# Patient Record
Sex: Male | Born: 1960 | Race: White | Hispanic: No | Marital: Single | Smoking: Never smoker
Health system: Southern US, Community
[De-identification: ages and names within clinical notes are randomized; demographics above are authoritative.]

## PROBLEM LIST (undated history)

## (undated) DIAGNOSIS — I10 Essential (primary) hypertension: Secondary | ICD-10-CM

---

## 2010-03-03 ENCOUNTER — Encounter: Payer: Self-pay | Admitting: Family Medicine

## 2012-12-11 ENCOUNTER — Ambulatory Visit: Payer: Self-pay

## 2014-12-27 DIAGNOSIS — H35033 Hypertensive retinopathy, bilateral: Secondary | ICD-10-CM | POA: Insufficient documentation

## 2014-12-27 DIAGNOSIS — H2510 Age-related nuclear cataract, unspecified eye: Secondary | ICD-10-CM | POA: Insufficient documentation

## 2015-01-03 DIAGNOSIS — B5801 Toxoplasma chorioretinitis: Secondary | ICD-10-CM | POA: Insufficient documentation

## 2015-01-31 DIAGNOSIS — H4312 Vitreous hemorrhage, left eye: Secondary | ICD-10-CM | POA: Insufficient documentation

## 2015-04-25 DIAGNOSIS — H35372 Puckering of macula, left eye: Secondary | ICD-10-CM | POA: Insufficient documentation

## 2015-10-12 ENCOUNTER — Encounter (HOSPITAL_COMMUNITY): Payer: Self-pay | Admitting: Emergency Medicine

## 2015-10-12 ENCOUNTER — Ambulatory Visit (INDEPENDENT_AMBULATORY_CARE_PROVIDER_SITE_OTHER): Payer: BLUE CROSS/BLUE SHIELD

## 2015-10-12 ENCOUNTER — Ambulatory Visit (HOSPITAL_COMMUNITY)
Admission: EM | Admit: 2015-10-12 | Discharge: 2015-10-12 | Disposition: A | Discharge status: Home / Self Care | Payer: BLUE CROSS/BLUE SHIELD | Attending: Family | Admitting: Family

## 2015-10-12 DIAGNOSIS — S2232XA Fracture of one rib, left side, initial encounter for closed fracture: Secondary | ICD-10-CM

## 2015-10-12 HISTORY — DX: Essential (primary) hypertension: I10

## 2015-10-12 MED ORDER — KETOROLAC TROMETHAMINE 60 MG/2ML IM SOLN
60.0000 mg | Freq: Once | INTRAMUSCULAR | Status: AC
  Administered 2015-10-12: 60 mg via INTRAMUSCULAR

## 2015-10-12 MED ORDER — DICLOFENAC SODIUM 75 MG PO TBEC: 75.0000 mg | DELAYED_RELEASE_TABLET | Freq: Two times a day (BID) | ORAL | 0 refills | Status: DC | PRN

## 2015-10-12 MED ORDER — KETOROLAC TROMETHAMINE 60 MG/2ML IM SOLN
INTRAMUSCULAR | Status: AC
  Filled 2015-10-12: qty 2

## 2015-10-12 MED ORDER — HYDROCODONE-ACETAMINOPHEN 5-325 MG PO TABS: 1.0000 | ORAL_TABLET | Freq: Four times a day (QID) | ORAL | 0 refills | Status: DC | PRN

## 2015-10-12 NOTE — ED Triage Notes (Signed)
Pt reports he fell from his deck today around 1500 and landed on left side of rib  Pain increases w/activity and when he is breathing  Slow gait... A&O x4... NAD

## 2015-10-12 NOTE — ED Provider Notes (Signed)
CSN: [id number]     Arrival date & time 10/12/15  1912 History   First MD Initiated Contact with Patient 10/12/15 2023     Chief Complaint  Patient presents with  . Rib Injury   (Consider location/radiation/quality/duration/timing/severity/associated sxs/prior Treatment) 55 year old male presents with left sided thoracic back pain after a fall this afternoon. He was carrying mason jars in a box when he stepped down off his deck and slipped. He landed on his left side on his back. He did not hit any other area of his body. He experiences an Increase in pain when he moves and breathes deeply. Took 2 Aleve and still very sore. Concerned over fractured ribs. He has fractured ribs when he was a teenager. No other chronic health issues except history of hypertension- takes no daily medication.       Past Medical History:  Diagnosis Date  . Hypertension    History reviewed. No pertinent surgical history. History reviewed. No pertinent family history. Social History  Substance Use Topics  . Smoking status: Never Smoker  . Smokeless tobacco: Never Used  . Alcohol use No    Review of Systems  Constitutional: Negative for fever.  Respiratory: Negative for cough, chest tightness and shortness of breath.   Gastrointestinal: Negative for nausea and vomiting.  Genitourinary: Negative for dysuria, flank pain and hematuria.  Musculoskeletal: Positive for back pain. Negative for neck pain.  Skin: Negative for color change and wound.  Neurological: Negative for dizziness, tremors, syncope, weakness, light-headedness, numbness and headaches.  Hematological: Negative.     Allergies  Review of patient's allergies indicates no known allergies.  Home Medications   Prior to Admission medications   Medication Sig Start Date End Date Taking? Authorizing Provider  diclofenac (VOLTAREN) 75 MG EC tablet Take 1 tablet (75 mg total) by mouth 2 (two) times daily as needed for moderate pain. 10/12/15   Sudie Grumbling, NP  HYDROcodone-acetaminophen (NORCO/VICODIN) 5-325 MG tablet Take 1-2 tablets by mouth every 6 (six) hours as needed for moderate pain. 10/12/15   Sudie Grumbling, NP   Meds Ordered and Administered this Visit   Medications  ketorolac (TORADOL) injection 60 mg (60 mg Intramuscular Given 10/12/15 2051)    BP 136/84 (BP Location: Left Arm)   Pulse 91   Temp 98.7 F (37.1 C) (Oral)   Resp 16   SpO2 98%  No data found.   Physical Exam  Constitutional: He is oriented to person, place, and time. He appears well-developed and well-nourished. No distress.  HENT:  Head: Normocephalic and atraumatic.  Neck: Normal range of motion. Neck supple.  Cardiovascular: Normal rate, regular rhythm and normal heart sounds.   Pulmonary/Chest: Effort normal and breath sounds normal. No respiratory distress. He has no wheezes. He has no rales. He exhibits no tenderness.  Musculoskeletal: He exhibits tenderness.       Thoracic back: He exhibits tenderness, swelling and pain.       Back:  Very tender along 7th/8th rib posterior mid-scapular. Slight swelling present but no bruising or abrasions. Has full range of motion of back but pain with extension.   Neurological: He is alert and oriented to person, place, and time. He has normal strength. No sensory deficit. Coordination and gait normal.  Skin: Skin is warm and dry. Capillary refill takes less than 2 seconds. No erythema.  Psychiatric: He has a normal mood and affect. His behavior is normal. Judgment and thought content normal.    Urgent  Care Course   Clinical Course    Procedures (including critical care time)  Labs Review Labs Reviewed - No data to display  Imaging Review Dg Chest 2 View  Result Date: 10/12/2015 CLINICAL DATA:  Patient fell at 3 p.m. today. Abrasions to the right shoulder. Left-sided chest and rib pain. Previous rib fractures at the age of 55. EXAM: CHEST  2 VIEW COMPARISON:  None. FINDINGS: Shallow  inspiration with elevation of the left hemidiaphragm. Normal heart size and pulmonary vascularity. Central interstitial pattern to the lungs suggest chronic bronchitis. No focal airspace disease or consolidation. No blunting of costophrenic angles. No pneumothorax. Multiple left rib deformities likely representing old fractures. Probable acute fracture of the left posterior eighth rib. Wedge deformity of T12 is likely chronic given associated degenerative changes. IMPRESSION: Elevation of left hemidiaphragm. Probable chronic bronchitic changes in the lungs. Probable acute left posterior eighth rib fracture. Old appearing left rib fractures and T12 compression. Electronically Signed   By: Burman NievesWilliam  Stevens M.D.   On: 10/12/2015 20:30     Visual Acuity Review  Right Eye Distance:   Left Eye Distance:   Bilateral Distance:    Right Eye Near:   Left Eye Near:    Bilateral Near:         MDM   1. Rib fracture, left, closed, initial encounter    Reviewed x-ray findings with patient that showed 8th posterior rib fracture. Discussed findings on x-ray suggestive of chronic bronchitis. Pt admits to smoking cigars on occasion. Due to severe pain, patient given Toradol 60mg  IM today. Recommend Diclofenac 75mg  twice a day as needed for pain. May take Vicodin #10 1-2 tablets every 6 hours as needed. Reviewed [place] Controlled Substance Registry for past and current Rx- no entries found. Encouraged to take deep breaths to minimize risk of pneumonia. Recommend follow-up with his PCP if symptoms do not improve within 3 days.     Sudie GrumblingAnn Berry Elster Corbello, NP 10/12/15 [phone]

## 2015-10-12 NOTE — Discharge Instructions (Signed)
You were given a shot of Toradol today to help with pain. Take Diclofenac twice a day as needed for pain. May also take Vicodin every 6 hours as needed for pain. Encouraged to continue to take deep breaths. Follow-up with your primary care provider if pain does not improve within 3 to 5 days.

## 2015-10-14 ENCOUNTER — Ambulatory Visit (HOSPITAL_COMMUNITY)
Admission: EM | Admit: 2015-10-14 | Discharge: 2015-10-14 | Disposition: A | Discharge status: Home / Self Care | Payer: BLUE CROSS/BLUE SHIELD | Attending: Family Medicine | Admitting: Family Medicine

## 2015-10-14 ENCOUNTER — Encounter (HOSPITAL_COMMUNITY): Payer: Self-pay | Admitting: Emergency Medicine

## 2015-10-14 DIAGNOSIS — R0781 Pleurodynia: Secondary | ICD-10-CM | POA: Diagnosis not present

## 2015-10-14 DIAGNOSIS — R059 Cough, unspecified: Secondary | ICD-10-CM

## 2015-10-14 DIAGNOSIS — R05 Cough: Secondary | ICD-10-CM

## 2015-10-14 MED ORDER — TRAMADOL HCL 50 MG PO TABS: 50.0000 mg | ORAL_TABLET | Freq: Four times a day (QID) | ORAL | 0 refills | Status: DC | PRN

## 2015-10-14 MED ORDER — ALBUTEROL SULFATE HFA 108 (90 BASE) MCG/ACT IN AERS: 2.0000 | INHALATION_SPRAY | Freq: Four times a day (QID) | RESPIRATORY_TRACT | 0 refills | Status: DC | PRN

## 2015-10-14 MED ORDER — ALBUTEROL SULFATE HFA 108 (90 BASE) MCG/ACT IN AERS: 2.0000 | INHALATION_SPRAY | Freq: Four times a day (QID) | RESPIRATORY_TRACT | 0 refills | Status: AC | PRN

## 2015-10-14 NOTE — ED Triage Notes (Signed)
Pt c/o persistent pain on left rib... Seen here on 8/31 and treated for fx rib  Reports he is running out of his pain meds  A&O x4... NAD

## 2015-10-14 NOTE — Discharge Instructions (Signed)
Continue Diclofenac twice a day as needed for pain and inflammation. Start Tramadol every 6 hours as needed for pain. Use Albuterol inhaler every 6 hours for cough/wheezing. Recommend referral to Orthopedics for further evaluation and treatment.

## 2015-10-15 NOTE — ED Provider Notes (Signed)
CSN: [id number]     Arrival date & time 10/14/15  1606 History   First MD Initiated Contact with Patient 10/14/15 1742     Chief Complaint  Patient presents with  . Follow-up  . Rib Injury   (Consider location/radiation/quality/duration/timing/severity/associated sxs/prior Treatment) 55 year old male presents for follow-up to rib fracture that occurred on 8/31. Was seen here at Urgent Care- was given a shot of Toradol, started on Diclofenac and given Vicodin 5/325 #10. His pain was controlled on 8/31 but pain is increasing since yesterday. He only has one Vicodin left. He is also coughing more which makes the rib pain worse. He has used Albuterol inhalers in the past for cough with good success. His blood pressure is also elevated today- may be due to pain.       Past Medical History:  Diagnosis Date  . Hypertension    History reviewed. No pertinent surgical history. History reviewed. No pertinent family history. Social History  Substance Use Topics  . Smoking status: Never Smoker  . Smokeless tobacco: Never Used  . Alcohol use No    Review of Systems  Constitutional: Negative for chills, fatigue and fever.  Respiratory: Positive for cough and wheezing. Negative for chest tightness and shortness of breath.   Cardiovascular: Chest pain: rib pain.  Gastrointestinal: Negative for constipation, nausea and vomiting.  Musculoskeletal: Positive for back pain (rib pain) and myalgias. Negative for joint swelling.  Skin: Negative.   Neurological: Negative for dizziness, syncope, weakness and numbness.    Allergies  Review of patient's allergies indicates no known allergies.  Home Medications   Prior to Admission medications   Medication Sig Start Date End Date Taking? Authorizing Provider  diclofenac (VOLTAREN) 75 MG EC tablet Take 1 tablet (75 mg total) by mouth 2 (two) times daily as needed for moderate pain. 10/12/15  Yes Sudie GrumblingAnn Berry Arik Husmann, NP  albuterol (PROVENTIL HFA;VENTOLIN  HFA) 108 (90 Base) MCG/ACT inhaler Inhale 2 puffs into the lungs every 6 (six) hours as needed for wheezing (or coughing). 10/14/15   Sudie GrumblingAnn Berry Modesty Rudy, NP  traMADol (ULTRAM) 50 MG tablet Take 1 tablet (50 mg total) by mouth every 6 (six) hours as needed for severe pain. 10/14/15   Sudie GrumblingAnn Berry Haygen Zebrowski, NP   Meds Ordered and Administered this Visit  Medications - No data to display  BP (!) 156/106 (BP Location: Left Arm)   Pulse 98   Temp 98.6 F (37 C) (Oral)   Resp 16   SpO2 97%  No data found.   Physical Exam  Constitutional: He is oriented to person, place, and time. He appears well-developed and well-nourished. He is cooperative. No distress.  Cardiovascular: Normal rate, regular rhythm and normal heart sounds.   Pulmonary/Chest: Effort normal. No respiratory distress. He has wheezes in the right upper field and the left upper field. He has no rhonchi. He has no rales.     He exhibits tenderness. He exhibits no edema, no deformity and no swelling.  Very tender along 7th and 8th rib posteriorly, No bruising or redness. Pain with any movement. Has full air exchange but upper airway wheezes present.    Musculoskeletal: He exhibits tenderness.  Neurological: He is alert and oriented to person, place, and time.  Skin: Skin is warm and dry.  Psychiatric: He has a normal mood and affect. His behavior is normal. Judgment and thought content normal.    Urgent Care Course   Clinical Course    Procedures (including critical care  time)  Labs Review Labs Reviewed - No data to display  Imaging Review No results found.   Visual Acuity Review  Right Eye Distance:   Left Eye Distance:   Bilateral Distance:    Right Eye Near:   Left Eye Near:    Bilateral Near:         MDM   1. Rib pain on left side   2. Cough    Recommend Albuterol inhaler 2 puffs every 6 hours as needed for wheezing and cough. Encouraged to splint chest with pillow for comfort when coughing. May take  Tramadol 50mg  #20 1 tablet every 6 hours as needed for pain. Recommend referral to Orthopedic through PCP for additional management of rib fracture. Rest. May apply warm compresses to area for comfort. Follow-up with Orthopedic as planned.     Sudie Grumbling, NP 10/15/15 [phone]

## 2015-12-20 DIAGNOSIS — H30002 Unspecified focal chorioretinal inflammation, left eye: Secondary | ICD-10-CM | POA: Insufficient documentation

## 2017-04-14 IMAGING — DX DG CHEST 2V
3 series · 3 of 3 positions shown · non-contrast
Comparison: None.

CLINICAL DATA: Patient fell at 3 p.m. today. Abrasions to the right
shoulder. Left-sided chest and rib pain. Previous rib fractures at
the age of 15.

EXAM:
CHEST  2 VIEW

[chest pa]
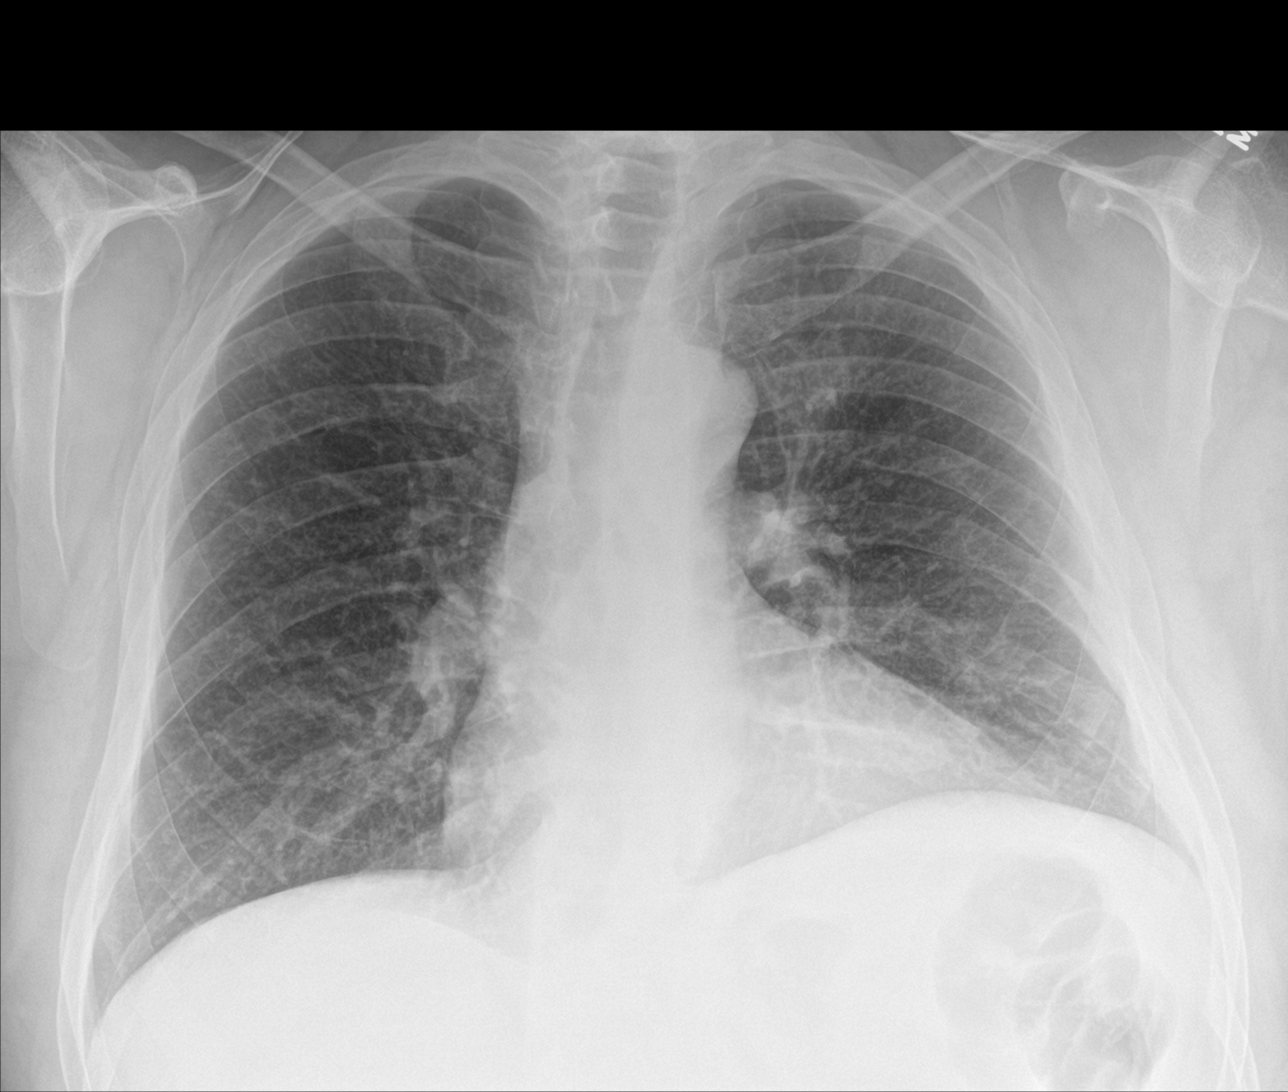

[chest lat (1 of 2)]
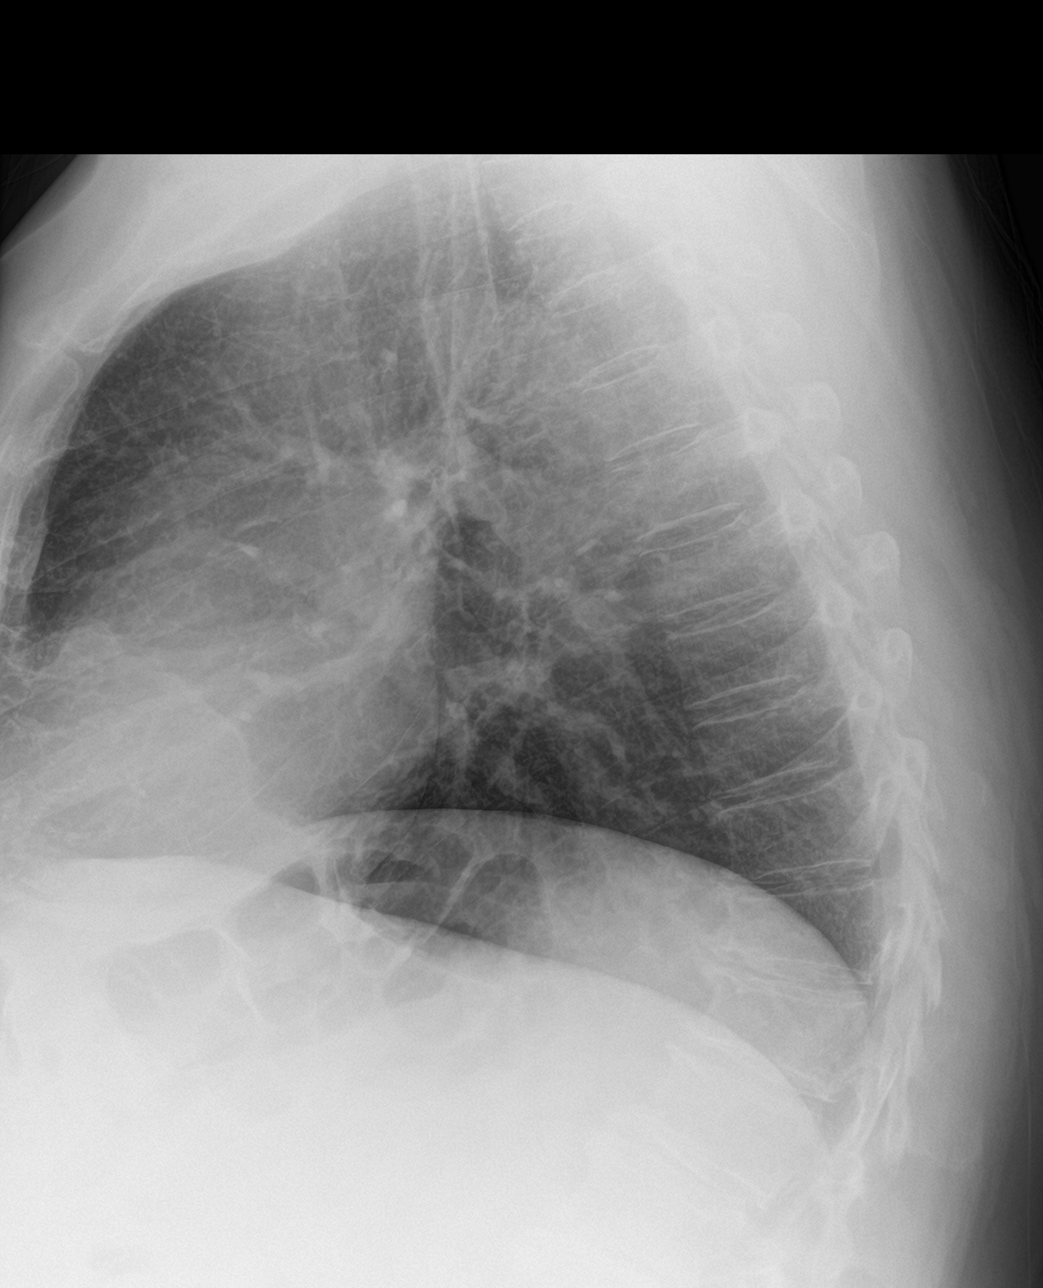

[chest lat (2 of 2)]
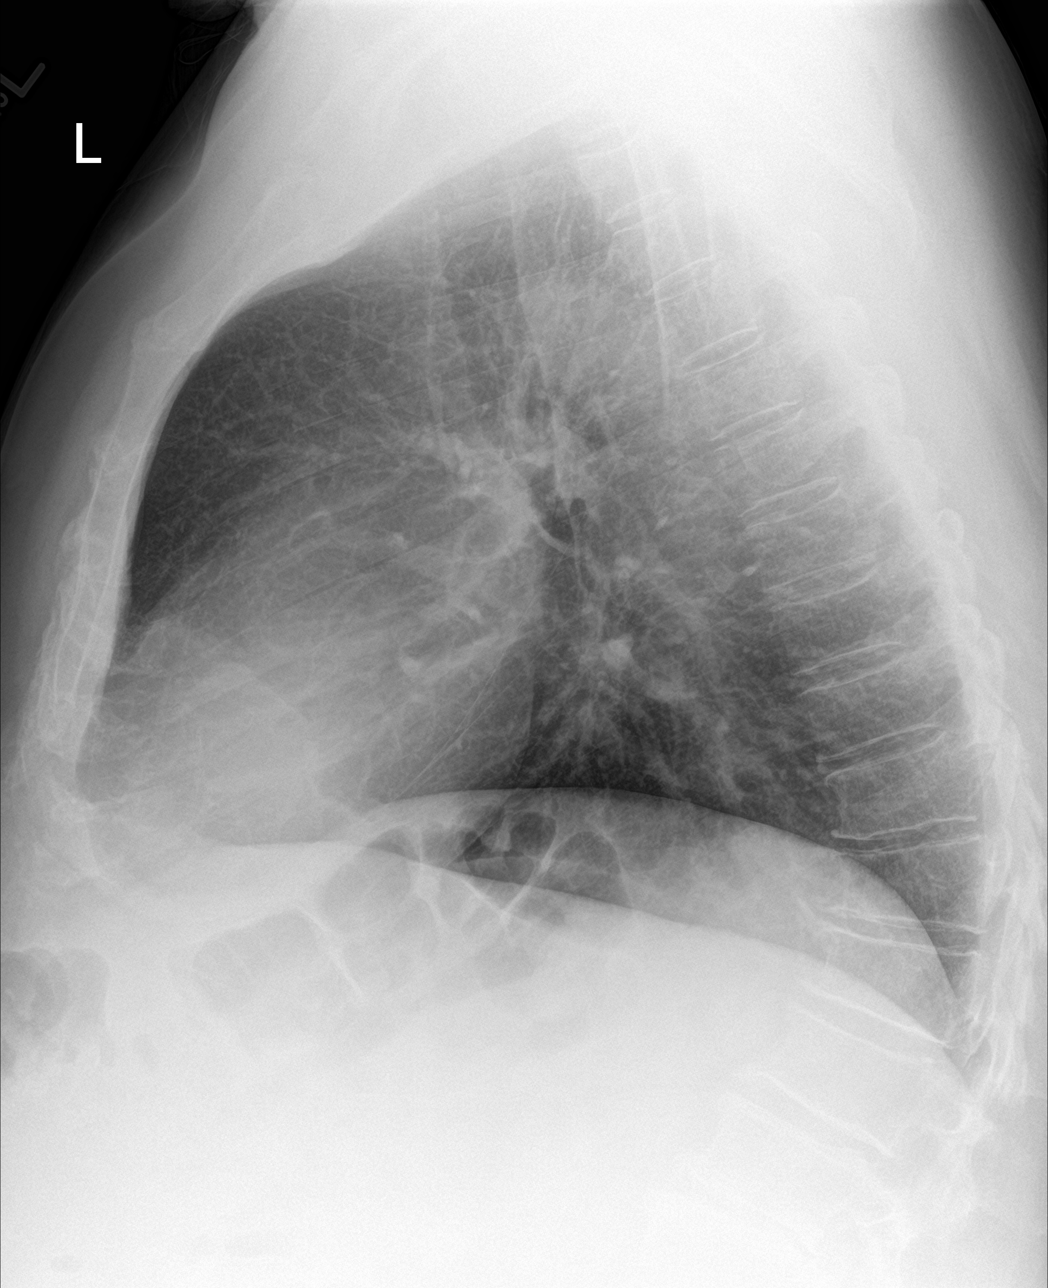

[3 of 3 positions shown; findings below may reference images not displayed]

FINDINGS: Shallow inspiration with elevation of the left hemidiaphragm. Normal
heart size and pulmonary vascularity. Central interstitial pattern
to the lungs suggest chronic bronchitis. No focal airspace disease
or consolidation. No blunting of costophrenic angles. No
pneumothorax. Multiple left rib deformities likely representing old
fractures. Probable acute fracture of the left posterior eighth rib.
Wedge deformity of T12 is likely chronic given associated
degenerative changes.
IMPRESSION: Elevation of left hemidiaphragm. Probable chronic bronchitic changes
in the lungs. Probable acute left posterior eighth rib fracture. Old
appearing left rib fractures and T12 compression.

## 2017-12-17 DIAGNOSIS — H5315 Visual distortions of shape and size: Secondary | ICD-10-CM | POA: Insufficient documentation

## 2017-12-17 DIAGNOSIS — H5231 Anisometropia: Secondary | ICD-10-CM | POA: Insufficient documentation

## 2019-08-18 DIAGNOSIS — H43822 Vitreomacular adhesion, left eye: Secondary | ICD-10-CM | POA: Insufficient documentation

## 2019-08-18 DIAGNOSIS — H2513 Age-related nuclear cataract, bilateral: Secondary | ICD-10-CM | POA: Insufficient documentation

## 2021-12-29 DIAGNOSIS — J189 Pneumonia, unspecified organism: Secondary | ICD-10-CM | POA: Insufficient documentation

## 2021-12-29 DIAGNOSIS — E871 Hypo-osmolality and hyponatremia: Secondary | ICD-10-CM | POA: Insufficient documentation

## 2021-12-29 DIAGNOSIS — D62 Acute posthemorrhagic anemia: Secondary | ICD-10-CM | POA: Insufficient documentation

## 2021-12-29 DIAGNOSIS — J9601 Acute respiratory failure with hypoxia: Secondary | ICD-10-CM | POA: Insufficient documentation

## 2021-12-29 DIAGNOSIS — I10 Essential (primary) hypertension: Secondary | ICD-10-CM | POA: Insufficient documentation

## 2021-12-29 DIAGNOSIS — J90 Pleural effusion, not elsewhere classified: Secondary | ICD-10-CM | POA: Insufficient documentation

## 2021-12-30 DIAGNOSIS — E222 Syndrome of inappropriate secretion of antidiuretic hormone: Secondary | ICD-10-CM | POA: Insufficient documentation

## 2022-01-08 ENCOUNTER — Other Ambulatory Visit: Payer: Self-pay

## 2022-01-08 DIAGNOSIS — J90 Pleural effusion, not elsewhere classified: Secondary | ICD-10-CM

## 2022-01-24 ENCOUNTER — Institutional Professional Consult (permissible substitution) (INDEPENDENT_AMBULATORY_CARE_PROVIDER_SITE_OTHER): Payer: Commercial Managed Care - HMO | Admitting: Thoracic Surgery (Cardiothoracic Vascular Surgery)

## 2022-01-24 ENCOUNTER — Encounter: Payer: Self-pay | Admitting: Thoracic Surgery (Cardiothoracic Vascular Surgery)

## 2022-01-24 ENCOUNTER — Ambulatory Visit
Admission: RE | Admit: 2022-01-24 | Discharge: 2022-01-24 | Disposition: A | Discharge status: Home / Self Care | Payer: Commercial Managed Care - HMO | Source: Ambulatory Visit | Attending: Thoracic Surgery (Cardiothoracic Vascular Surgery) | Admitting: Thoracic Surgery (Cardiothoracic Vascular Surgery)

## 2022-01-24 VITALS — BP 115/78 | HR 82 | Resp 20 | Ht 72.0 in | Wt 263.0 lb

## 2022-01-24 DIAGNOSIS — J90 Pleural effusion, not elsewhere classified: Secondary | ICD-10-CM

## 2022-01-24 DIAGNOSIS — J942 Hemothorax: Secondary | ICD-10-CM
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
## 2022-01-30 ENCOUNTER — Other Ambulatory Visit: Payer: Self-pay | Admitting: *Deleted

## 2022-01-30 DIAGNOSIS — J90 Pleural effusion, not elsewhere classified: Secondary | ICD-10-CM

## 2022-02-07 ENCOUNTER — Other Ambulatory Visit: Payer: Self-pay

## 2022-02-07 ENCOUNTER — Encounter: Payer: Self-pay | Admitting: Thoracic Surgery (Cardiothoracic Vascular Surgery)

## 2022-02-07 ENCOUNTER — Ambulatory Visit
Admission: RE | Admit: 2022-02-07 | Discharge: 2022-02-07 | Disposition: A | Discharge status: Home / Self Care | Payer: Commercial Managed Care - HMO | Source: Ambulatory Visit | Attending: Thoracic Surgery (Cardiothoracic Vascular Surgery) | Admitting: Thoracic Surgery (Cardiothoracic Vascular Surgery)

## 2022-02-07 ENCOUNTER — Ambulatory Visit (INDEPENDENT_AMBULATORY_CARE_PROVIDER_SITE_OTHER): Payer: Commercial Managed Care - HMO | Admitting: Thoracic Surgery (Cardiothoracic Vascular Surgery)

## 2022-02-07 VITALS — BP 140/88 | HR 78 | Resp 20 | Ht 72.0 in | Wt 260.0 lb

## 2022-02-07 DIAGNOSIS — J90 Pleural effusion, not elsewhere classified: Secondary | ICD-10-CM

## 2022-02-07 DIAGNOSIS — J942 Hemothorax: Secondary | ICD-10-CM | POA: Diagnosis not present
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
## 2022-02-08 ENCOUNTER — Other Ambulatory Visit: Payer: Self-pay | Admitting: *Deleted

## 2022-02-08 DIAGNOSIS — J90 Pleural effusion, not elsewhere classified: Secondary | ICD-10-CM

## 2022-02-13 ENCOUNTER — Ambulatory Visit: Payer: Commercial Managed Care - HMO | Admitting: Thoracic Surgery (Cardiothoracic Vascular Surgery)

## 2022-02-14 NOTE — Progress Notes (Signed)
Surgical Instructions    Your procedure is scheduled on Monday, January 8th, 2024.   Report to [facility] Main Entrance "A" at 09:30 A.M., then check in with the Admitting office.  Call this number if you have problems the morning of surgery:  [phone]   If you have any questions prior to your surgery date call [phone]: Open Monday-Friday 8am-4pm If you experience any cold or flu symptoms such as cough, fever, chills, shortness of breath, etc. between now and your scheduled surgery, please notify us at the above number     Remember:  Do not eat or drink after midnight the night before your surgery    Take these medicines the morning of surgery with A SIP OF WATER:   bisoprolol (ZEBETA)  tamsulosin (FLOMAX)   As needed:  albuterol (PROVENTIL HFA;VENTOLIN HFA)  HYDROcodone-acetaminophen (NORCO/VICODIN)   Please bring all inhalers with you the day of surgery.    As of today, STOP taking any Aspirin (unless otherwise instructed by your surgeon) Aleve, Naproxen, Ibuprofen, Motrin, Advil, Goody's, BC's, all herbal medications, fish oil, and all vitamins.   The day of surgery:           Do not wear jewelry  Do not wear lotions, powders, cologne or deodorant. Men may shave face and neck. Do not bring valuables to the hospital.  [facility] is not responsible for any belongings or valuables.    Do NOT Smoke (Tobacco/Vaping)  24 hours prior to your procedure  If you use a CPAP at night, you may bring your mask for your overnight stay.   Contacts, glasses, hearing aids, dentures or partials may not be worn into surgery, please bring cases for these belongings   For patients admitted to the hospital, discharge time will be determined by your treatment team.   Patients discharged the day of surgery will not be allowed to drive home, and someone needs to stay with them for 24 hours.   SURGICAL WAITING ROOM VISITATION Patients having surgery or a procedure may have no  more than 2 support people in the waiting area - these visitors may rotate.   Children under the age of 63 must have an adult with them who is not the patient. If the patient needs to stay at the hospital during part of their recovery, the visitor guidelines for inpatient rooms apply. Pre-op nurse will coordinate an appropriate time for 1 support person to accompany patient in pre-op.  This support person may not rotate.   Please refer to [email or web address] for the visitor guidelines for Inpatients (after your surgery is over and you are in a regular room).    Special instructions:    Oral Hygiene is also important to reduce your risk of infection.  Remember - BRUSH YOUR TEETH THE MORNING OF SURGERY WITH YOUR REGULAR TOOTHPASTE   [place]- Preparing For Surgery  Before surgery, you can play an important role. Because skin is not sterile, your skin needs to be as free of germs as possible. You can reduce the number of germs on your skin by washing with CHG (chlorahexidine gluconate) Soap before surgery.  CHG is an antiseptic cleaner which kills germs and bonds with the skin to continue killing germs even after washing.     Please do not use if you have an allergy to CHG or antibacterial soaps. If your skin becomes reddened/irritated stop using the CHG.  Do not shave (including legs and underarms) for at least 48 hours prior to  first CHG shower. It is OK to shave your face.  Please follow these instructions carefully.     Shower the NIGHT BEFORE SURGERY and the MORNING OF SURGERY with CHG Soap.   If you chose to wash your hair, wash your hair first as usual with your normal shampoo. After you shampoo, rinse your hair and body thoroughly to remove the shampoo.  Then [employer] and genitals (private parts) with your normal soap and rinse thoroughly to remove soap.  After that Use CHG Soap as you would any other liquid soap. You can apply CHG  directly to the skin and wash gently with a scrungie or a clean washcloth.   Apply the CHG Soap to your body ONLY FROM THE NECK DOWN.  Do not use on open wounds or open sores. Avoid contact with your eyes, ears, mouth and genitals (private parts). Wash Face and genitals (private parts)  with your normal soap.   Wash thoroughly, paying special attention to the area where your surgery will be performed.  Thoroughly rinse your body with warm water from the neck down.  DO NOT shower/wash with your normal soap after using and rinsing off the CHG Soap.  Pat yourself dry with a CLEAN TOWEL.  Wear CLEAN PAJAMAS to bed the night before surgery  Place CLEAN SHEETS on your bed the night before your surgery  DO NOT SLEEP WITH PETS.   Day of Surgery:  Take a shower with CHG soap. Wear Clean/Comfortable clothing the morning of surgery Do not apply any deodorants/lotions.   Remember to brush your teeth WITH YOUR REGULAR TOOTHPASTE.    If you received a COVID test during your pre-op visit, it is requested that you wear a mask when out in public, stay away from anyone that may not be feeling well, and notify your surgeon if you develop symptoms. If you have been in contact with anyone that has tested positive in the last 10 days, please notify your surgeon.    Please read over the following fact sheets that you were given.

## 2022-02-15 ENCOUNTER — Other Ambulatory Visit: Payer: Self-pay | Admitting: *Deleted

## 2022-02-15 ENCOUNTER — Ambulatory Visit (HOSPITAL_COMMUNITY)
Admission: RE | Admit: 2022-02-15 | Discharge: 2022-02-15 | Disposition: A | Discharge status: Home / Self Care | Payer: Commercial Managed Care - HMO | Source: Ambulatory Visit | Attending: Thoracic Surgery (Cardiothoracic Vascular Surgery) | Admitting: Thoracic Surgery (Cardiothoracic Vascular Surgery)

## 2022-02-15 ENCOUNTER — Encounter (HOSPITAL_COMMUNITY): Payer: Self-pay | Admitting: Vascular Surgery

## 2022-02-15 ENCOUNTER — Encounter (HOSPITAL_COMMUNITY): Payer: Self-pay

## 2022-02-15 ENCOUNTER — Other Ambulatory Visit: Payer: Self-pay

## 2022-02-15 ENCOUNTER — Encounter (HOSPITAL_COMMUNITY)
Admission: RE | Admit: 2022-02-15 | Discharge: 2022-02-15 | Disposition: A | Discharge status: Home / Self Care | Payer: Commercial Managed Care - HMO | Source: Ambulatory Visit | Attending: Thoracic Surgery (Cardiothoracic Vascular Surgery) | Admitting: Thoracic Surgery (Cardiothoracic Vascular Surgery)

## 2022-02-15 DIAGNOSIS — Z01818 Encounter for other preprocedural examination: Secondary | ICD-10-CM | POA: Insufficient documentation

## 2022-02-15 DIAGNOSIS — M4854XA Collapsed vertebra, not elsewhere classified, thoracic region, initial encounter for fracture: Secondary | ICD-10-CM | POA: Diagnosis not present

## 2022-02-15 DIAGNOSIS — I10 Essential (primary) hypertension: Secondary | ICD-10-CM | POA: Diagnosis not present

## 2022-02-15 DIAGNOSIS — E871 Hypo-osmolality and hyponatremia: Secondary | ICD-10-CM | POA: Diagnosis not present

## 2022-02-15 DIAGNOSIS — J90 Pleural effusion, not elsewhere classified: Secondary | ICD-10-CM | POA: Insufficient documentation

## 2022-02-15 LAB — COMPREHENSIVE METABOLIC PANEL
ALT: 20 U/L (ref 0–44)
AST: 29 U/L (ref 15–41)
Albumin: 4 g/dL (ref 3.5–5.0)
Alkaline Phosphatase: 86 U/L (ref 38–126)
Anion gap: 13 (ref 5–15)
BUN: 12 mg/dL (ref 6–20)
CO2: 23 mmol/L (ref 22–32)
Calcium: 9.4 mg/dL (ref 8.9–10.3)
Chloride: 92 mmol/L — ABNORMAL LOW (ref 98–111)
Creatinine, Ser: 0.9 mg/dL (ref 0.61–1.24)
GFR, Estimated: >60 mL/min (ref >60)
Glucose, Bld: 107 mg/dL — ABNORMAL HIGH (ref 70–99)
Potassium: 4.6 mmol/L (ref 3.5–5.1)
Sodium: 128 mmol/L — ABNORMAL LOW (ref 135–145)
Total Bilirubin: 0.5 mg/dL (ref 0.3–1.2)
Total Protein: 7.2 g/dL (ref 6.5–8.1)

## 2022-02-15 LAB — URINALYSIS, ROUTINE W REFLEX MICROSCOPIC
Bilirubin Urine: NEGATIVE
Glucose, UA: NEGATIVE mg/dL
Hgb urine dipstick: NEGATIVE
Ketones, ur: NEGATIVE mg/dL
Leukocytes,Ua: NEGATIVE
Nitrite: NEGATIVE
Protein, ur: NEGATIVE mg/dL
Specific Gravity, Urine: 1.013 (ref 1.005–1.030)
pH: 5 (ref 5.0–8.0)

## 2022-02-15 LAB — BLOOD GAS, ARTERIAL
Acid-Base Excess: 5.6 mmol/L — ABNORMAL HIGH (ref 0.0–2.0)
Bicarbonate: 29.8 mmol/L — ABNORMAL HIGH (ref 20.0–28.0)
Drawn by: 6443
O2 Saturation: 96.3 %
Patient temperature: 37
pCO2 arterial: 41 mmHg (ref 32–48)
pH, Arterial: 7.47 — ABNORMAL HIGH (ref 7.35–7.45)
pO2, Arterial: 73 mmHg — ABNORMAL LOW (ref 83–108)

## 2022-02-15 LAB — CBC
HCT: 40.6 % (ref 39.0–52.0)
Hemoglobin: 14 g/dL (ref 13.0–17.0)
MCH: 33.4 pg (ref 26.0–34.0)
MCHC: 34.5 g/dL (ref 30.0–36.0)
MCV: 96.9 fL (ref 80.0–100.0)
Platelets: 246 10*3/uL (ref 150–400)
RBC: 4.19 MIL/uL — ABNORMAL LOW (ref 4.22–5.81)
RDW: 13.3 % (ref 11.5–15.5)
WBC: 8.1 10*3/uL (ref 4.0–10.5)
nRBC: 0 % (ref 0.0–0.2)

## 2022-02-15 LAB — TYPE AND SCREEN
ABO/RH(D): A POS
Antibody Screen: NEGATIVE

## 2022-02-15 LAB — SURGICAL PCR SCREEN
MRSA, PCR: NEGATIVE
Staphylococcus aureus: NEGATIVE

## 2022-02-15 LAB — PROTIME-INR
INR: 0.9 (ref 0.8–1.2)
Prothrombin Time: 12.5 seconds (ref 11.4–15.2)

## 2022-02-15 LAB — APTT: aPTT: 30 seconds (ref 24–36)
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
## 2022-02-15 NOTE — Progress Notes (Addendum)
PCP - Everardo Beals Cardiologist - Tonia Ghent  PPM/ICD - Denies  Chest x-ray - 02/15/2021 PAT  EKG - 02/15/2021 PAT Stress Test - Cards recommended Stress Test.  Per pt has not done yet ECHO - 12/30/2021 Cardiac Cath - Denies  Sleep Study - Denies.  Bang score faxed to PCP.   DM- Denies   Blood Thinner Instructions: Denies Aspirin Instructions:  As of today, STOP taking any Aspirin (unless otherwise instructed by your surgeon) Aleve, Naproxen, Ibuprofen, Motrin, Advil, Goody's, BC's, all herbal medications, fish oil, and all vitamins.  Pt expressed understanding.   ERAS Protcol - N/A.  NPO ordered.   COVID TEST- Yes, 02/15/2021   Anesthesia review: Yes, cardiac hx  Patient denies shortness of breath, fever, cough and chest pain at PAT appointment   All instructions explained to the patient, with a verbal understanding of the material. Patient agrees to go over the instructions while at home for a better understanding. Patient also instructed to self quarantine after being tested for COVID-19. The opportunity to ask questions was provided.

## 2022-02-18 ENCOUNTER — Inpatient Hospital Stay (HOSPITAL_COMMUNITY)
Admission: RE | Admit: 2022-02-18 | Payer: Commercial Managed Care - HMO | Source: Ambulatory Visit | Admitting: Thoracic Surgery (Cardiothoracic Vascular Surgery)

## 2022-02-18 SURGERY — VIDEO ASSISTED THORACOSCOPY (VATS)/DECORTICATION
Anesthesia: General | Site: Chest | Laterality: Right

## 2022-03-19 ENCOUNTER — Encounter: Payer: Self-pay | Admitting: Internal Medicine

## 2022-03-19 ENCOUNTER — Telehealth: Payer: Self-pay

## 2022-03-19 ENCOUNTER — Ambulatory Visit: Payer: Commercial Managed Care - HMO | Attending: Internal Medicine | Admitting: Internal Medicine

## 2022-03-19 VITALS — BP 166/100 | HR 70 | Ht 74.0 in | Wt 260.0 lb

## 2022-03-19 DIAGNOSIS — F1090 Alcohol use, unspecified, uncomplicated: Secondary | ICD-10-CM

## 2022-03-19 DIAGNOSIS — I10 Essential (primary) hypertension: Secondary | ICD-10-CM | POA: Diagnosis not present

## 2022-03-19 DIAGNOSIS — E222 Syndrome of inappropriate secretion of antidiuretic hormone: Secondary | ICD-10-CM

## 2022-03-19 DIAGNOSIS — J90 Pleural effusion, not elsewhere classified: Secondary | ICD-10-CM

## 2022-03-19 DIAGNOSIS — I4510 Unspecified right bundle-branch block: Secondary | ICD-10-CM

## 2022-03-19 DIAGNOSIS — I509 Heart failure, unspecified: Secondary | ICD-10-CM | POA: Diagnosis not present

## 2022-03-19 DIAGNOSIS — I440 Atrioventricular block, first degree: Secondary | ICD-10-CM

## 2022-03-19 DIAGNOSIS — Z789 Other specified health status: Secondary | ICD-10-CM | POA: Insufficient documentation

## 2022-03-19 MED ORDER — DAPAGLIFLOZIN PROPANEDIOL 10 MG PO TABS: 10.0000 mg | ORAL_TABLET | Freq: Every day | ORAL | 3 refills | Status: DC

## 2022-03-19 MED ORDER — METOPROLOL TARTRATE 100 MG PO TABS: ORAL_TABLET | ORAL | 0 refills | Status: AC

## 2022-03-19 MED ORDER — SPIRONOLACTONE 25 MG PO TABS: 25.0000 mg | ORAL_TABLET | Freq: Every day | ORAL | 3 refills | Status: DC
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
## 2022-03-19 NOTE — Telephone Encounter (Signed)
MD signature obtained and faxed to [employer].  Fax Ok received.

## 2022-03-19 NOTE — Patient Instructions (Signed)
Medication Instructions:  Your physician has recommended you make the following change in your medication:  START: dapagliflozin proponediol (Farxiga) 10 mg by mouth once daily before breakfast  We have provided you with 30 days of samples, a 30 day free trial offer and information for patient assistance START: spironolactone (Aldactone) 25 mg by mouth once daily  *If you need a refill on your cardiac medications before your next appointment, please call your pharmacy*   Lab Work: TODAY: BMP, BNP IN 1-2 WEEKS: BMP  If you have labs (blood work) drawn today and your tests are completely normal, you will receive your results only by: [place] (if you have MyChart) OR A paper copy in the mail If you have any lab test that is abnormal or we need to change your treatment, we will call you to review the results.   Testing/Procedures: Your physician has requested that you have an echocardiogram. Echocardiography is a painless test that uses sound waves to create images of your heart. It provides your doctor with information about the size and shape of your heart and how well your heart's chambers and valves are working. This procedure takes approximately one hour. There are no restrictions for this procedure. Please do NOT wear cologne, perfume, aftershave, or lotions (deodorant is allowed). Please arrive 15 minutes prior to your appointment time.  Your physician has requested that you have cardiac CT. Cardiac computed tomography (CT) is a painless test that uses an x-ray machine to take clear, detailed pictures of your heart. For further information please visit [email or web address].[email or web address]. Please follow instruction sheet as given.     Follow-Up: At [facility], you and your health needs are our priority.  As part of our continuing mission to provide you with exceptional heart care, we have created designated Provider Care Teams.  These Care Teams include your primary Cardiologist  (physician) and Advanced Practice Providers (APPs -  Physician Assistants and Nurse Practitioners) who all work together to provide you with the care you need, when you need it.  We recommend signing up for the patient portal called "MyChart".  Sign up information is provided on this After Visit Summary.  MyChart is used to connect with patients for Virtual Visits (Telemedicine).  Patients are able to view lab/test results, encounter notes, upcoming appointments, etc.  Non-urgent messages can be sent to your provider as well.   To learn more about what you can do with MyChart, go to [email or web address].    Your next appointment:   3 month(s)  Provider:   Werner Lean, MD  or Nicholes Rough, PA-C, Ambrose Pancoast, NP, or Christen Bame, NP         Other Instructions  Your cardiac CT will be scheduled at one of the below locations:   [facility] [address] [place], [place] [address] [phone]  [place] [address] [place], [place] [address] [phone]  [facility] [place], [place] [address] [phone]  If scheduled at [facility], please arrive at the [facility] and Children's Entrance (Entrance C2) of [facility] 30 minutes prior to test start time. You can use the FREE valet parking offered at entrance C (encouraged to control the heart rate for the test)  Proceed to the [facility] Radiology Department (first floor) to check-in and test prep.  All radiology patients and guests should use entrance C2 at [facility]  [facility], accessed from [facility], even though the hospital's physical address listed is [address].    If scheduled at [facility] or [facility], please arrive 15 mins early for check-in and test prep.   Please follow these instructions  carefully (unless otherwise directed):  Hold all erectile dysfunction medications at least 3 days (72 hrs) prior to test. (Ie viagra, cialis, sildenafil, tadalafil, etc) We will administer nitroglycerin during this exam.   On the Night Before the Test: Be sure to Drink plenty of water. Do not consume any caffeinated/decaffeinated beverages or chocolate 12 hours prior to your test. Do not take any antihistamines 12 hours prior to your test.  On the Day of the Test: Drink plenty of water until 1 hour prior to the test. Do not eat any food 1 hour prior to test. You may take your regular medications prior to the test.  Take metoprolol (Lopressor) 100 mg  two hours prior to test. If you take Torsemide, Furosemide/Hydrochlorothiazide/Spironolactone, please HOLD on the morning of the test.         After the Test: Drink plenty of water. After receiving IV contrast, you may experience a mild flushed feeling. This is normal. On occasion, you may experience a mild rash up to 24 hours after the test. This is not dangerous. If this occurs, you can take Benadryl 25 mg and increase your fluid intake. If you experience trouble breathing, this can be serious. If it is severe call 911 IMMEDIATELY. If it is mild, please call our office. If you take any of these medications: Glipizide/Metformin, Avandament, Glucavance, please do not take 48 hours after completing test unless otherwise instructed.  We will call to schedule your test 2-4 weeks out understanding that some insurance companies will need an authorization prior to the service being performed.   For non-scheduling related questions, please contact the cardiac imaging nurse navigator should you have any questions/concerns: Marchia Bond, Cardiac Imaging Nurse Navigator Gordy Clement, Cardiac Imaging Nurse Navigator [place] [facility] Direct Office Dial: [phone]   For scheduling needs, including cancellations and  rescheduling, please call [place], [phone].

## 2022-03-19 NOTE — Telephone Encounter (Signed)
**Note De-identified Jams Trickett Obfuscation** -----  **Note De-Identified Vincentina Sollers Obfuscation** Message from Precious Gilding, RN sent at 03/19/2022 12:15 PM EST ----- Regarding: Delene Loll patient assistance Hi, this pt is a transfer from [facility].  He is currently receiving Entresto through [employer] pt assistance. Could you have paperwork shifted to our office so that he can continue with program. He is currently almost out of medication.  Thanks  Elza Rafter, RN

## 2022-03-19 NOTE — Telephone Encounter (Signed)
**Note De-Identified Quasean Frye Obfuscation** I called [employer] and was advised that the pts enrollment/approval for their [facility] program ended on 02/10/2022.  Per Paris the pt needs to re-apply ASAP.  She states that we can fill out the providers page of a [employer] application and fax it to them and that they maybe able to provide a refill to the pt. with just that information.  Paris also states that they are mailing the pt an application today and that it will take 5 to 7 business days to arrive at his home address.  I called the pt but got no answer and his mail box is full and he does not have [place].  I have completed the providers page of a [employer] application and have e-mailed it to Dr Oralia Rud nurse so she can obtain his signature, date it, and to then fax it to [employer] at the fax number written on the cover letter included.

## 2022-03-21 LAB — BASIC METABOLIC PANEL
BUN/Creatinine Ratio: 12 (ref 9–20)
BUN: 9 mg/dL (ref 6–24)
CO2: 25 mmol/L (ref 20–29)
Calcium: 10 mg/dL (ref 8.7–10.2)
Chloride: 90 mmol/L — ABNORMAL LOW (ref 96–106)
Creatinine, Ser: 0.77 mg/dL (ref 0.76–1.27)
Glucose: 113 mg/dL — ABNORMAL HIGH (ref 70–99)
Potassium: 4.9 mmol/L (ref 3.5–5.2)
Sodium: 132 mmol/L — ABNORMAL LOW (ref 134–144)
eGFR: 103 mL/min/{1.73_m2} (ref >59)

## 2022-03-21 LAB — PRO B NATRIURETIC PEPTIDE: NT-Pro BNP: 404 pg/mL — ABNORMAL HIGH (ref 0–210)

## 2022-03-26 ENCOUNTER — Encounter: Payer: Self-pay | Admitting: Internal Medicine

## 2022-03-28 ENCOUNTER — Telehealth: Payer: Self-pay

## 2022-03-28 ENCOUNTER — Other Ambulatory Visit: Payer: Self-pay

## 2022-03-28 MED ORDER — TORSEMIDE 20 MG PO TABS: 20.0000 mg | ORAL_TABLET | Freq: Every day | ORAL | 3 refills | Status: DC

## 2022-03-28 MED ORDER — ENTRESTO 97-103 MG PO TABS: 1.0000 | ORAL_TABLET | Freq: Two times a day (BID) | ORAL | 1 refills | Status: DC

## 2022-03-28 NOTE — Telephone Encounter (Signed)
The patient has been notified of the result and verbalized understanding.  All questions (if any) were answered. Precious Gilding, RN 03/28/2022 4:10 PM   Pt already scheduled for labs on 04/09/22.

## 2022-03-28 NOTE — Telephone Encounter (Signed)
-----   Message from Werner Lean, MD sent at 03/24/2022  2:32 PM EST ----- No- good catch. Let's try increase to torsemide 20 mg and f/u labs. ----- Message ----- From: Precious Gilding, RN Sent: 03/22/2022   3:44 PM EST To: Werner Lean, MD  Pt started on Farxiga at 03/19/22 OV did you want to change med to [place]?  ----- Message ----- From: Werner Lean, MD Sent: 03/22/2022  12:29 PM EST To: Precious Gilding, RN  Sodium is improving. Volume is still increased. Add Jardiance 10 mg and BMP in two weeks; he will need patient assist paperwork. Needs to continue to cut back his amount of alcohol consumption. Given sodium levels, result need to be sent to PCP for monitoring

## 2022-04-09 ENCOUNTER — Other Ambulatory Visit: Payer: Self-pay | Admitting: Internal Medicine

## 2022-04-09 ENCOUNTER — Ambulatory Visit: Payer: Commercial Managed Care - HMO

## 2022-04-09 ENCOUNTER — Ambulatory Visit (HOSPITAL_COMMUNITY): Payer: Commercial Managed Care - HMO | Attending: Internal Medicine

## 2022-04-09 ENCOUNTER — Other Ambulatory Visit: Payer: Self-pay | Admitting: *Deleted

## 2022-04-09 DIAGNOSIS — I509 Heart failure, unspecified: Secondary | ICD-10-CM

## 2022-04-09 DIAGNOSIS — I5030 Unspecified diastolic (congestive) heart failure: Secondary | ICD-10-CM

## 2022-04-09 DIAGNOSIS — I517 Cardiomegaly: Secondary | ICD-10-CM

## 2022-04-09 LAB — ECHOCARDIOGRAM COMPLETE
Area-P 1/2: 2.87 cm2
S' Lateral: 3.2 cm

## 2022-04-10 ENCOUNTER — Telehealth: Payer: Self-pay | Admitting: Internal Medicine

## 2022-04-10 DIAGNOSIS — E875 Hyperkalemia: Secondary | ICD-10-CM

## 2022-04-10 LAB — BASIC METABOLIC PANEL
BUN/Creatinine Ratio: 24 — ABNORMAL HIGH (ref 9–20)
BUN: 27 mg/dL — ABNORMAL HIGH (ref 6–24)
CO2: 23 mmol/L (ref 20–29)
Calcium: 10.5 mg/dL — ABNORMAL HIGH (ref 8.7–10.2)
Chloride: 93 mmol/L — ABNORMAL LOW (ref 96–106)
Creatinine, Ser: 1.14 mg/dL (ref 0.76–1.27)
Glucose: 96 mg/dL (ref 70–99)
Potassium: 5.7 mmol/L — ABNORMAL HIGH (ref 3.5–5.2)
Sodium: 133 mmol/L — ABNORMAL LOW (ref 134–144)
eGFR: 74 mL/min/{1.73_m2} (ref >59)

## 2022-04-10 NOTE — Telephone Encounter (Signed)
The patient has been notified of the result and verbalized understanding.  All questions (if any) were answered. Precious Gilding, RN 04/10/2022 2:26 PM   Pt will HOLD spironolactone until MD advises further.  Will have repeat labs on 04/16/22.  Lab results routed to PCP to address low Na  level pt questions if should take sodium pills; referred pt to PCP.

## 2022-04-10 NOTE — Telephone Encounter (Signed)
Patient returned call for lab results.  

## 2022-04-10 NOTE — Telephone Encounter (Signed)
-----   Message from Werner Lean, MD sent at 04/10/2022 10:45 AM EST ----- Results: Hyperkalemia on Aldactone Echo with preserved ED Plan: Hold aldactone, if normalizes we may start lower dose  Werner Lean, MD

## 2022-04-16 ENCOUNTER — Ambulatory Visit: Payer: Commercial Managed Care - HMO | Attending: Internal Medicine

## 2022-04-16 DIAGNOSIS — E875 Hyperkalemia: Secondary | ICD-10-CM

## 2022-04-17 LAB — BASIC METABOLIC PANEL
BUN/Creatinine Ratio: 28 — ABNORMAL HIGH (ref 9–20)
BUN: 36 mg/dL — ABNORMAL HIGH (ref 6–24)
CO2: 22 mmol/L (ref 20–29)
Calcium: 10.7 mg/dL — ABNORMAL HIGH (ref 8.7–10.2)
Chloride: 86 mmol/L — ABNORMAL LOW (ref 96–106)
Creatinine, Ser: 1.27 mg/dL (ref 0.76–1.27)
Glucose: 110 mg/dL — ABNORMAL HIGH (ref 70–99)
Potassium: 4.8 mmol/L (ref 3.5–5.2)
Sodium: 129 mmol/L — ABNORMAL LOW (ref 134–144)
eGFR: 65 mL/min/{1.73_m2} (ref >59)

## 2022-05-23 ENCOUNTER — Encounter (HOSPITAL_COMMUNITY): Payer: Self-pay

## 2022-06-18 ENCOUNTER — Ambulatory Visit: Payer: Commercial Managed Care - HMO | Admitting: Physician Assistant
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
## 2022-06-28 ENCOUNTER — Encounter: Payer: Self-pay | Admitting: Internal Medicine

## 2022-06-28 ENCOUNTER — Ambulatory Visit: Payer: Commercial Managed Care - HMO | Attending: Physician Assistant | Admitting: Internal Medicine

## 2022-06-28 VITALS — BP 138/78 | HR 63 | Ht 74.0 in | Wt 254.0 lb

## 2022-06-28 DIAGNOSIS — Z789 Other specified health status: Secondary | ICD-10-CM

## 2022-06-28 DIAGNOSIS — I440 Atrioventricular block, first degree: Secondary | ICD-10-CM

## 2022-06-28 DIAGNOSIS — E222 Syndrome of inappropriate secretion of antidiuretic hormone: Secondary | ICD-10-CM

## 2022-06-28 DIAGNOSIS — I10 Essential (primary) hypertension: Secondary | ICD-10-CM

## 2022-06-28 DIAGNOSIS — E871 Hypo-osmolality and hyponatremia: Secondary | ICD-10-CM

## 2022-06-28 DIAGNOSIS — I4510 Unspecified right bundle-branch block: Secondary | ICD-10-CM | POA: Diagnosis not present

## 2022-06-28 DIAGNOSIS — I5032 Chronic diastolic (congestive) heart failure: Secondary | ICD-10-CM

## 2022-06-28 MED ORDER — DAPAGLIFLOZIN PROPANEDIOL 10 MG PO TABS: 10.0000 mg | ORAL_TABLET | Freq: Every day | ORAL | 3 refills | Status: DC

## 2022-06-28 MED ORDER — ENTRESTO 49-51 MG PO TABS: 1.0000 | ORAL_TABLET | Freq: Two times a day (BID) | ORAL | 11 refills | Status: DC

## 2022-06-28 MED ORDER — TORSEMIDE 20 MG PO TABS: 20.0000 mg | ORAL_TABLET | Freq: Every day | ORAL | 3 refills | Status: DC
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
## 2022-06-28 NOTE — Patient Instructions (Signed)
Medication Instructions:  Your physician has recommended you make the following change in your medication:   1) STOP spironolactone (aldactone) 2) DECREASE Entresto to 49-51mg  twice daily  *If you need a refill on your cardiac medications before your next appointment, please call your pharmacy*  Lab Work: In 2 weeks: BNP, BMP If you have labs (blood work) drawn today and your tests are completely normal, you will receive your results only by: MyChart Message (if you have MyChart) OR A paper copy in the mail If you have any lab test that is abnormal or we need to change your treatment, we will call you to review the results.  Testing/Procedures: None ordered today.  Follow-Up: At [facility], you and your health needs are our priority.  As part of our continuing mission to provide you with exceptional heart care, we have created designated Provider Care Teams.  These Care Teams include your primary Cardiologist (physician) and Advanced Practice Providers (APPs -  Physician Assistants and Nurse Practitioners) who all work together to provide you with the care you need, when you need it.  Your next appointment:   5-6 month(s)  Provider:   Christell Constant, MD  or Jari Favre, PA-C, Robin Searing, NP, Eligha Bridegroom, NP, or Tereso Newcomer, PA-C

## 2022-07-11 ENCOUNTER — Ambulatory Visit: Payer: Commercial Managed Care - HMO

## 2022-07-22 ENCOUNTER — Other Ambulatory Visit: Payer: Self-pay | Admitting: Internal Medicine

## 2022-07-22 NOTE — Telephone Encounter (Signed)
Pt's pharmacy is requesting a prior auth for pt's medication farxiga. Pt would like a call conerning this matter. Please address

## 2022-07-22 NOTE — Telephone Encounter (Signed)
*  STAT* If patient is at the pharmacy, call can be transferred to refill team.   1. Which medications need to be refilled? (please list name of each medication and dose if known) Farxiga  2. Which pharmacy/location (including street and city if local pharmacy) is medication to be sent to? [facility] [address], [place]  3. Do they need a 30 day or 90 day supply? 90 days-please call in today,been  patient says he has been waiting on this since 06-28-22

## 2022-07-23 ENCOUNTER — Telehealth: Payer: Self-pay

## 2022-07-23 ENCOUNTER — Other Ambulatory Visit (HOSPITAL_COMMUNITY): Payer: Self-pay

## 2022-07-23 NOTE — Telephone Encounter (Signed)
PA initiated, please see separate encounter for updates on determination. (I will route you back in once a decision has been made)  Tiquan Bouch, CPhT Pharmacy Patient Advocate Specialist Direct Number: [phone] Fax: [phone]  

## 2022-07-23 NOTE — Telephone Encounter (Signed)
Pharmacy Patient Advocate Encounter  Prior Authorization for Marcelline Deist has been approved.    Effective dates: 07/09/22 through 07/23/23  Haze Rushing, CPhT Pharmacy Patient Advocate Specialist Direct Number: [phone] Fax: [phone]

## 2022-07-23 NOTE — Telephone Encounter (Signed)
Pharmacy Patient Advocate Encounter   Received notification from [facility] MEDICAID  that prior authorization for FARXIGA 10 MG  is needed.    PA submitted on 07/23/22 Key [id number] Status is pending  Haze Rushing, CPhT Pharmacy Patient Advocate Specialist Direct Number: [phone] Fax: [phone]

## 2022-07-23 NOTE — Telephone Encounter (Signed)
Please sign/close this when you are able. I can't close the encounter because there is a med order attached to it, but per separate encounter, PA has been approved.

## 2022-07-24 MED ORDER — DAPAGLIFLOZIN PROPANEDIOL 10 MG PO TABS: 10.0000 mg | ORAL_TABLET | Freq: Every day | ORAL | 3 refills | Status: DC

## 2022-07-25 ENCOUNTER — Ambulatory Visit: Payer: Medicaid Other | Attending: Internal Medicine

## 2022-12-16 ENCOUNTER — Ambulatory Visit: Payer: Commercial Managed Care - HMO | Admitting: Internal Medicine

## 2022-12-31 ENCOUNTER — Ambulatory Visit: Payer: Medicaid Other | Attending: Internal Medicine | Admitting: Internal Medicine
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
## 2023-01-01 ENCOUNTER — Encounter: Payer: Self-pay | Admitting: Internal Medicine

## 2023-05-04 ENCOUNTER — Other Ambulatory Visit: Payer: Self-pay | Admitting: Internal Medicine

## 2023-06-27 ENCOUNTER — Other Ambulatory Visit: Payer: Self-pay

## 2023-06-27 ENCOUNTER — Other Ambulatory Visit: Payer: Self-pay | Admitting: Internal Medicine

## 2023-06-27 MED ORDER — TORSEMIDE 20 MG PO TABS: 20.0000 mg | ORAL_TABLET | Freq: Every day | ORAL | 0 refills | Status: DC

## 2023-07-08 ENCOUNTER — Other Ambulatory Visit: Payer: Self-pay | Admitting: Internal Medicine

## 2023-07-25 ENCOUNTER — Other Ambulatory Visit: Payer: Self-pay | Admitting: Internal Medicine

## 2023-07-29 ENCOUNTER — Encounter: Payer: Self-pay | Admitting: Internal Medicine

## 2023-07-29 NOTE — Telephone Encounter (Signed)
 error

## 2023-07-31 ENCOUNTER — Encounter: Payer: Self-pay | Admitting: Nurse Practitioner

## 2023-07-31 ENCOUNTER — Ambulatory Visit: Attending: Nurse Practitioner | Admitting: Nurse Practitioner

## 2023-07-31 VITALS — BP 138/72 | HR 100 | Ht 74.0 in | Wt 240.0 lb

## 2023-07-31 DIAGNOSIS — I440 Atrioventricular block, first degree: Secondary | ICD-10-CM | POA: Diagnosis present

## 2023-07-31 DIAGNOSIS — I5032 Chronic diastolic (congestive) heart failure: Secondary | ICD-10-CM | POA: Diagnosis present

## 2023-07-31 DIAGNOSIS — I10 Essential (primary) hypertension: Secondary | ICD-10-CM | POA: Diagnosis present

## 2023-07-31 DIAGNOSIS — I4510 Unspecified right bundle-branch block: Secondary | ICD-10-CM

## 2023-07-31 DIAGNOSIS — F1090 Alcohol use, unspecified, uncomplicated: Secondary | ICD-10-CM | POA: Diagnosis present

## 2023-07-31 DIAGNOSIS — E222 Syndrome of inappropriate secretion of antidiuretic hormone: Secondary | ICD-10-CM | POA: Diagnosis present

## 2023-07-31 MED ORDER — BISOPROLOL FUMARATE 5 MG PO TABS: 5.0000 mg | ORAL_TABLET | Freq: Every day | ORAL | 3 refills | Status: DC

## 2023-07-31 MED ORDER — ENTRESTO 49-51 MG PO TABS: 1.0000 | ORAL_TABLET | Freq: Two times a day (BID) | ORAL | 11 refills | Status: AC

## 2023-07-31 MED ORDER — TORSEMIDE 20 MG PO TABS: 20.0000 mg | ORAL_TABLET | Freq: Every day | ORAL | 3 refills | Status: AC

## 2023-07-31 NOTE — Patient Instructions (Addendum)
 Medication Instructions:  Your refills have been sent to your pharmacy. *If you need a refill on your cardiac medications before your next appointment, please call your pharmacy*   Lab Work: Labs will be drawn today If you have labs (blood work) drawn today and your tests are completely normal, you will receive your results only by: MyChart Message (if you have MyChart) OR A paper copy in the mail If you have any lab test that is abnormal or we need to change your treatment, we will call you to review the results.   Testing/Procedures: No procedures were ordered during today's visit.    Follow-Up: At [facility], you and your health needs are our priority.  As part of our continuing mission to provide you with exceptional heart care, we have created designated Provider Care Teams.  These Care Teams include your primary Cardiologist (physician) and Advanced Practice Providers (APPs -  Physician Assistants and Nurse Practitioners) who all work together to provide you with the care you need, when you need it.  We recommend signing up for the patient portal called MyChart.  Sign up information is provided on this After Visit Summary.  MyChart is used to connect with patients for Virtual Visits (Telemedicine).  Patients are able to view lab/test results, encounter notes, upcoming appointments, etc.  Non-urgent messages can be sent to your provider as well.   To learn more about what you can do with MyChart, go to [email or web address].    Your next appointment:   6 month(s)  Provider:   Jann Melody, MD    Other Instructions A letter will be mailed to you as a reminder to call the office for your next follow up appointment.   Thank you for choosing [facility]!
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
## 2023-08-01 LAB — COMPREHENSIVE METABOLIC PANEL WITH GFR
ALT: 27 IU/L (ref 0–44)
AST: 30 IU/L (ref 0–40)
Albumin: 5 g/dL — ABNORMAL HIGH (ref 3.8–4.9)
Alkaline Phosphatase: 86 IU/L (ref 44–121)
BUN/Creatinine Ratio: 11 (ref 10–24)
BUN: 10 mg/dL (ref 8–27)
Bilirubin Total: 0.7 mg/dL (ref 0.0–1.2)
CO2: 22 mmol/L (ref 20–29)
Calcium: 10 mg/dL (ref 8.6–10.2)
Chloride: 93 mmol/L — ABNORMAL LOW (ref 96–106)
Creatinine, Ser: 0.89 mg/dL (ref 0.76–1.27)
Globulin, Total: 2.8 g/dL (ref 1.5–4.5)
Glucose: 97 mg/dL (ref 70–99)
Potassium: 4.6 mmol/L (ref 3.5–5.2)
Sodium: 135 mmol/L (ref 134–144)
Total Protein: 7.8 g/dL (ref 6.0–8.5)
eGFR: 98 mL/min/{1.73_m2} (ref >59)

## 2023-08-01 LAB — CBC
Hematocrit: 51.9 % — ABNORMAL HIGH (ref 37.5–51.0)
Hemoglobin: 17.9 g/dL — ABNORMAL HIGH (ref 13.0–17.7)
MCH: 33.9 pg — ABNORMAL HIGH (ref 26.6–33.0)
MCHC: 34.5 g/dL (ref 31.5–35.7)
MCV: 98 fL — ABNORMAL HIGH (ref 79–97)
Platelets: 258 10*3/uL (ref 150–450)
RBC: 5.28 x10E6/uL (ref 4.14–5.80)
RDW: 13.2 % (ref 11.6–15.4)
WBC: 8.9 10*3/uL (ref 3.4–10.8)

## 2023-08-02 ENCOUNTER — Encounter: Payer: Self-pay | Admitting: Nurse Practitioner

## 2023-08-04 ENCOUNTER — Telehealth: Payer: Self-pay | Admitting: Pharmacy Technician

## 2023-08-04 ENCOUNTER — Other Ambulatory Visit (HOSPITAL_COMMUNITY): Payer: Self-pay

## 2023-08-04 NOTE — Telephone Encounter (Signed)
 Pharmacy Patient Advocate Encounter   Received notification from Fax that prior authorization for Bisoprolol  5MG  is required/requested.   Insurance verification completed.   The patient is insured through [facility] [place] .   Per test claim: PA required; PA submitted to above mentioned insurance via CoverMyMeds Key/confirmation #/EOC [facility] Status is pending

## 2023-08-04 NOTE — Telephone Encounter (Signed)
 Will forward bisoprolol  denial message from our PA Team, to Damien Braver NP and covering to further advise on further treatment options/next-steps for this pt.  When Damien Braver NP further advises on this matter, triage nursing to follow-up with the pt accordingly thereafter.

## 2023-08-04 NOTE — Telephone Encounter (Signed)
 Pharmacy Patient Advocate Encounter  Received notification from [employer] [facility] [employer] that Prior Authorization for Bisoprolol  has been DENIED.  See denial reason below. No denial letter attached in CMM. Will attach denial letter to Media tab once received.   PA #/Case ID/Reference #: [id number]

## 2023-08-07 ENCOUNTER — Ambulatory Visit: Payer: Self-pay | Admitting: Nurse Practitioner

## 2023-08-07 NOTE — Telephone Encounter (Signed)
 Spoke with pt. Pt was previously on Bisoprolol  and it was covered. This is the first time the medication hasn't been covered. Pt is ok with taking a medication similar to Bisoprolol . The current cost for Bisoprolol  is over $70.

## 2023-08-08 ENCOUNTER — Other Ambulatory Visit (HOSPITAL_COMMUNITY): Payer: Self-pay

## 2023-08-08 ENCOUNTER — Telehealth: Payer: Self-pay | Admitting: Pharmacy Technician

## 2023-08-08 ENCOUNTER — Encounter: Payer: Self-pay | Admitting: Internal Medicine

## 2023-08-08 ENCOUNTER — Other Ambulatory Visit: Payer: Self-pay

## 2023-08-08 MED ORDER — METOPROLOL SUCCINATE ER 50 MG PO TB24: 50.0000 mg | ORAL_TABLET | Freq: Every day | ORAL | 3 refills | Status: AC

## 2023-08-08 NOTE — Telephone Encounter (Signed)
 Metoprolol  Succinate 50 mg daily was sent into pts pharmacy, ok per Damien Braver NP due to Bisoprolol  not being covered under pts insurance. Message was sent to [facility] under pharmacy note to let them know that pt can start Metoprolol  Succinate 50 mg.

## 2023-08-08 NOTE — Telephone Encounter (Signed)
 Error

## 2023-08-08 NOTE — Telephone Encounter (Signed)
 Another pa was submitted by pharmacy.   Per insurance Potential Alternatives are: [id number] - NEBIVOLOL HCL, [id number] - PROPRANOLOL HCL, [id number]   Per last encounter metoprolol  was sent in. Just checked pharmacy has metoprolol 

## 2023-08-08 NOTE — Telephone Encounter (Signed)
 Caller Rodena) is following up on the status of patient's bisoprolol  (ZEBETA ) 5 MG tablet authorization.

## 2023-09-01 ENCOUNTER — Other Ambulatory Visit (HOSPITAL_COMMUNITY): Payer: Self-pay

## 2023-09-01 ENCOUNTER — Telehealth: Payer: Self-pay

## 2023-09-01 NOTE — Telephone Encounter (Signed)
 Pharmacy Patient Advocate Encounter  Received notification from [facility] Medicaid that Prior Authorization for FARXIGA  has been APPROVED from 08/18/23 to 08/31/24. Ran test claim, Copay is $4. This test claim was processed through [facility]- copay amounts may vary at other pharmacies due to pharmacy/plan contracts, or as the patient moves through the different stages of their insurance plan.

## 2023-09-01 NOTE — Telephone Encounter (Signed)
 Pharmacy Patient Advocate Encounter   Received notification from CoverMyMeds that prior authorization for FARXIGA  is required/requested.   Insurance verification completed.   The patient is insured through [facility] [place] .   Per test claim: PA required; PA submitted to above mentioned insurance via CoverMyMeds Key/confirmation #/EOC [facility] Status is pending

## 2023-10-03 ENCOUNTER — Emergency Department (HOSPITAL_BASED_OUTPATIENT_CLINIC_OR_DEPARTMENT_OTHER)
Admission: EM | Admit: 2023-10-03 | Discharge: 2023-10-03 | Disposition: A | Discharge status: Home / Self Care | Attending: Emergency Medicine | Admitting: Emergency Medicine

## 2023-10-03 ENCOUNTER — Other Ambulatory Visit: Payer: Self-pay

## 2023-10-03 DIAGNOSIS — S81802A Unspecified open wound, left lower leg, initial encounter: Secondary | ICD-10-CM | POA: Diagnosis present

## 2023-10-03 DIAGNOSIS — Z7982 Long term (current) use of aspirin: Secondary | ICD-10-CM | POA: Diagnosis not present

## 2023-10-03 DIAGNOSIS — X58XXXA Exposure to other specified factors, initial encounter: Secondary | ICD-10-CM | POA: Diagnosis not present

## 2023-10-03 MED ORDER — DOXYCYCLINE HYCLATE 100 MG PO TABS
100.0000 mg | ORAL_TABLET | Freq: Once | ORAL | Status: AC
  Administered 2023-10-03: 100 mg via ORAL
  Filled 2023-10-03: qty 1

## 2023-10-03 MED ORDER — DOXYCYCLINE HYCLATE 100 MG PO CAPS: 100.0000 mg | ORAL_CAPSULE | Freq: Two times a day (BID) | ORAL | 0 refills | Status: AC

## 2023-10-03 MED ORDER — ACETAMINOPHEN 500 MG PO TABS
1000.0000 mg | ORAL_TABLET | Freq: Once | ORAL | Status: AC
Start: 2023-10-03 — End: 2023-10-03
  Administered 2023-10-03: 1000 mg via ORAL
  Filled 2023-10-03: qty 2

## 2023-10-03 NOTE — ED Notes (Signed)
 Pt ankle bandaged and secured

## 2023-10-03 NOTE — ED Provider Notes (Signed)
 [place] EMERGENCY DEPARTMENT AT [facility] Provider Note   CSN: [id number] Arrival date & time: 10/03/23  1702     Patient presents with: Wound hemorrhage  HPI Roberto Butler is a 63 y.o. male presenting for wound hemorrhage.  He states he sustained multiple spider bites in the lower left leg.  He states in the last month 3 times including today the lower wound was spurting out blood.  He states he was able to control the bleeding with holding pressure with a towel.  Denies blood thinner use.  Denies fever.  Denies any notable swelling in the leg.  States he has been cleaning the wound with hydrogen peroxide.  Denies any new trauma to the leg.   HPI     Prior to Admission medications   Medication Sig Start Date End Date Taking? Authorizing Provider  doxycycline  (VIBRAMYCIN ) 100 MG capsule Take 1 capsule (100 mg total) by mouth 2 (two) times daily. 10/03/23  Yes Ettie Krontz K, PA-C  albuterol  (PROVENTIL  HFA;VENTOLIN  HFA) 108 (90 Base) MCG/ACT inhaler Inhale 2 puffs into the lungs every 6 (six) hours as needed for wheezing (or coughing). 10/14/15   Pearl Jenkins Lesches, NP  Ascorbic Acid (VITAMIN C PO) Take 1 tablet by mouth 3 (three) times a week.    [provider]  aspirin EC 325 MG tablet Take 325 mg by mouth daily.    [provider]  dapagliflozin  propanediol (FARXIGA ) 10 MG TABS tablet TAKE 1 TABLET(10 MG) BY MOUTH DAILY BEFORE BREAKFAST 05/05/23   Chandrasekhar, Mahesh A, MD  metoprolol  succinate (TOPROL -XL) 50 MG 24 hr tablet Take 1 tablet (50 mg total) by mouth daily. Take with or immediately following a meal. 08/08/23 11/06/23  Daneen Damien BROCKS, NP  metoprolol  tartrate (LOPRESSOR ) 100 MG tablet Take 2 hours prior to Cardiac CT Patient not taking: Reported on 07/31/2023 03/19/22   Santo Kelly A, MD  naproxen sodium (ALEVE) 220 MG tablet Take 220 mg by mouth in the morning and at bedtime. Mid morning & evening    [provider]   polyethylene glycol powder (GLYCOLAX/MIRALAX) 17 GM/SCOOP powder Take 17 g by mouth as needed for moderate constipation. 03/13/22   [provider]  sacubitril-valsartan (ENTRESTO ) 49-51 MG Take 1 tablet by mouth 2 (two) times daily. 07/31/23   Daneen Damien BROCKS, NP  tamsulosin (FLOMAX) 0.4 MG CAPS capsule Take 0.4 mg by mouth in the morning. Patient not taking: Reported on 07/31/2023    [provider]  torsemide  (DEMADEX ) 20 MG tablet Take 1 tablet (20 mg total) by mouth daily. 07/31/23   Daneen Damien BROCKS, NP    Allergies: Patient has no known allergies.    Review of Systems See HPI  Updated Vital Signs BP (!) 156/118 (BP Location: Left Arm)   Pulse 90   Temp 98.2 F (36.8 C) (Oral)   Resp 20   SpO2 98%   Physical Exam Constitutional:      Appearance: Normal appearance.  HENT:     Head: Normocephalic.     Nose: Nose normal.  Eyes:     Conjunctiva/sclera: Conjunctivae normal.  Pulmonary:     Effort: Pulmonary effort is normal.  Musculoskeletal:     Comments: Multiple wounds noted in the left lower leg with some surrounding erythema.  Not hot to touch.  No pustulant oozing.  No bleeding.  Pedal pulses 1+ bilaterally.  Patient is ambulatory.  No areas of fluctuance noted. See pic below.   Neurological:  Mental Status: He is alert.  Psychiatric:        Mood and Affect: Mood normal.     (all labs ordered are listed, but only abnormal results are displayed) Labs Reviewed - No data to display  EKG: None  Radiology: No results found.   Procedures   Medications Ordered in the ED  doxycycline  (VIBRA -TABS) tablet 100 mg (has no administration in time range)  acetaminophen  (TYLENOL ) tablet 1,000 mg (has no administration in time range)                                    Medical Decision Making  63 year old well-appearing male presenting for left leg wounds.  Exam notable for multiple wounds in the lower left leg could be residual lesions from prior  spider bite also bleeding could be varicose veins.  Bleeding resolved prior to arrival and there is been no witnessed bleeding here.  Applied nonadherent gauze and Coban to the lower wound.  Started him on Doxy for concerns of possible early cellulitis.  Advised him to follow-up with his PCP and discussed that he may need a vascular evaluation.  Provided vascular follow-up information if needed.  Discussed return precautions.  Discharged good condition.     Final diagnoses:  Wound of left lower extremity, initial encounter    ED Discharge Orders          Ordered    doxycycline  (VIBRAMYCIN ) 100 MG capsule  2 times daily        10/03/23 2058               Lang Norleen POUR, PA-C 10/03/23 2101    Mannie Pac T, DO 10/05/23 1510

## 2023-10-03 NOTE — ED Triage Notes (Signed)
 Pt caox4 ambulatory c/o bleeding from previous spider bite. Pt reports spider bite occurred in May and has not been healing well since. Multiple occurrences of bleeding from site. Pt states today the site started bleeding approx 1.5 hrs ago and was spurting blood and was difficult to control. Bleeding controlled on arrival to ED. Pt does not take blood thinner med.

## 2023-10-03 NOTE — Discharge Instructions (Addendum)
 Evaluation today was overall reassuring.  Concerned he may have varicose weight that may have been injured in someway.  Would recommend that you follow-up with your PCP but also have provided follow-up with a vascular surgeon should your symptoms persist.  If you have persistent bleeding you can return to the ED for further evaluation.  I am also starting on doxycycline  as that area in your lower leg is concerning for early cellulitis.  If you develop fever, worsening swelling or redness or pustular oozing please return to the ED for further evaluation.

## 2023-10-27 ENCOUNTER — Other Ambulatory Visit: Payer: Self-pay

## 2023-10-27 DIAGNOSIS — S81802A Unspecified open wound, left lower leg, initial encounter: Secondary | ICD-10-CM

## 2023-11-25 ENCOUNTER — Ambulatory Visit (HOSPITAL_COMMUNITY)
Admission: RE | Admit: 2023-11-25 | Discharge: 2023-11-25 | Disposition: A | Discharge status: Home / Self Care | Source: Ambulatory Visit | Attending: Vascular Surgery | Admitting: Vascular Surgery

## 2023-11-25 DIAGNOSIS — S81802A Unspecified open wound, left lower leg, initial encounter: Secondary | ICD-10-CM | POA: Diagnosis present

## 2023-12-02 ENCOUNTER — Ambulatory Visit: Admitting: Physician Assistant

## 2023-12-02 ENCOUNTER — Ambulatory Visit: Attending: Surgery | Admitting: Physician Assistant

## 2023-12-02 ENCOUNTER — Encounter: Payer: Self-pay | Admitting: Physician Assistant

## 2023-12-02 VITALS — BP 190/115 | HR 72 | Temp 97.3°F | Resp 18 | Ht 74.0 in | Wt 248.6 lb

## 2023-12-02 DIAGNOSIS — I872 Venous insufficiency (chronic) (peripheral): Secondary | ICD-10-CM | POA: Insufficient documentation

## 2023-12-02 DIAGNOSIS — L98492 Non-pressure chronic ulcer of skin of other sites with fat layer exposed: Secondary | ICD-10-CM | POA: Diagnosis not present

## 2023-12-02 DIAGNOSIS — S81802A Unspecified open wound, left lower leg, initial encounter: Secondary | ICD-10-CM | POA: Insufficient documentation

## 2023-12-02 DIAGNOSIS — I739 Peripheral vascular disease, unspecified: Secondary | ICD-10-CM | POA: Diagnosis not present

## 2023-12-02 NOTE — Progress Notes (Signed)
 Office Visit Note   Patient: Roberto Butler           Date of Birth: 07/29/1960           MRN: [id number] Visit Date: 12/02/2023              Requested by: Cristopher Suzen CHRISTELLA, NP [address] [place],  [place] [address] PCP: Cristopher Suzen CHRISTELLA, NP  Chief Complaint  Patient presents with   Left Leg - Open Wound      HPI: 63 y/o male with history of brown recluse spider bites to the left lower leg in May of 2025.  He denies history of non healing wounds.  He denies edema in his legs or skin changes prior to this injury.  He is able to walk without pain or claudication symptoms.  He has had bleeding episodes from a small wound anterior medial ankle over a varicose vein.  He has developed brawny skin changes  Past medical history HTN, no smoker.    Assessment & Plan: Visit Diagnoses:  1. 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  We discussed the importance of elevation while at rest.    Follow-Up Instructions: Return in about 1 week (around 12/09/2023).   Ortho Exam  Patient is alert, oriented, no adenopathy, well-dressed, normal affect, normal respiratory effort. Anterior lateral wound 1.3 cm x 1.3 cm with 85 % granulation tissue in the base after 4 x 4 Vashe debridement. Posterior wound 2.5 x 2 cm with 85 % granulation tissue after  4 x 4 Vashe debridement. Posterior distal flat 3 cm x 3.2 cm.  Anterior medial anterior scab over a varicose vein.  This scab was left intact.    I could not palpate his pedal pulses, he does have doppler signals in all 3 tibials.  He has large varicose veins B lower legs without a history of edema.  He has developed brawny skin changes in the area of the left LE where he has the wounds.    He is noted to have varicose veins GSV in the calf.  No DVT. GSV mid calf            yes    >500 ms      0.30               +--------------+---------+------+-----------+------------+--------+  GSV dist calf           yes    >500 ms      0.32            Imaging: No results found. No images are attached to the encounter.  Labs: No results found for: HGBA1C, ESRSEDRATE, CRP, LABURIC, REPTSTATUS, GRAMSTAIN, CULT, LABORGA   Lab Results  Component Value Date   ALBUMIN 5.0 (H) 07/31/2023   ALBUMIN 4.0 02/15/2022    No results found for: MG No results found for: VD25OH  No results found for: PREALBUMIN    Latest Ref Rng & Units 07/31/2023    4:32 PM 02/15/2022   10:17 AM  CBC EXTENDED  WBC 3.4 - 10.8 x10E3/uL 8.9  8.1   RBC 4.14 - 5.80 x10E6/uL 5.28  4.19   Hemoglobin 13.0 - 17.7 g/dL 82.0  85.9   HCT 62.4 - 51.0 % 51.9  40.6   Platelets 150 - 450 x10E3/uL 258  246      There is  no height or weight on file to calculate BMI.  Orders:  No orders of the defined types were placed in this encounter.  No orders of the defined types were placed in this encounter.    Procedures: No procedures performed  Clinical Data: No additional findings.  ROS:  All other systems negative, except as noted in the HPI. Review of Systems  Objective: Vital Signs: There were no vitals taken for this visit.  Specialty Comments:  No specialty comments available.  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 No pertinent family history.  History reviewed. No pertinent surgical history. Social History   Occupational History   Not on file  Tobacco Use   Smoking status: Never   Smokeless tobacco: Never  Substance and Sexual Activity   Alcohol use: Yes    Alcohol/week: 6.0 standard drinks of alcohol    Types: 6 Glasses of wine per week    Comment: Every weekend   Drug use: Not Currently   Sexual activity: Yes

## 2023-12-02 NOTE — Progress Notes (Signed)
 Requested by:  Cristopher Suzen HERO, NP [address] [place],  [place] [address]  Reason for consultation: left leg wounds    History of Present Illness   Roberto Butler is a 63 y.o. (Feb 06, 1961) male who presents for evaluation of left leg wounds.  The patient states that he was bit by a brown recluse spider in several places on his left lower leg back in May.  Since then he developed several wounds to his left leg. One of these wounds is overlying a small varicosity.  This wound specifically has had several bleeding events, which has been controlled with manual pressure.  He states that the wound has not bled for over a month now.  He says that his wounds have been very slow to heal, but have slightly improved since they first developed.  He was previously cleaning his wounds with hydrogen peroxide and Neosporin.  Recently he just switched to soap and water.  He denies any signs of infection such as erythema, fevers, or purulent drainage.  He has been on 2 courses of doxycycline .  He denies any prior history of venous or arterial ulcerations.  He denies any lower extremity swelling.  He does have varicosities on his legs, right greater than left, however these are not bothersome to him.  He says prior to this event, if he developed any wounds on his legs or feet he had no problem healing them.  He denies any claudication or rest pain.  He is not a diabetic.  He has never smoked cigarettes.  He says that he has occasionally smoked cigars.  Past Medical History:  Diagnosis Date   Hypertension     No past surgical history on file.  Social History   Socioeconomic History   Marital status: Single    Spouse name: Not on file   Number of children: Not on file   Years of education: Not on file   Highest education level: Not on file  Occupational History   Not on file  Tobacco Use   Smoking status: Never   Smokeless tobacco: Never  Substance and Sexual Activity   Alcohol use: Yes     Alcohol/week: 6.0 standard drinks of alcohol    Types: 6 Glasses of wine per week    Comment: Every weekend   Drug use: Not Currently   Sexual activity: Yes  Other Topics Concern   Not on file  Social History Narrative   Not on file   Social Drivers of Health   Financial Resource Strain: High Risk (12/29/2021)   Received from [facility]   Overall Financial Resource Strain (CARDIA)    Difficulty of Paying Living Expenses: Hard  Food Insecurity: Not on file  Transportation Needs: Not on file  Physical Activity: Not on file  Stress: Stress Concern Present (12/29/2021)   Received from [facility] Occupational Health - Occupational Stress Questionnaire    Feeling of Stress : To some extent  Social Connections: Unknown (12/29/2021)   Received from [facility]   Social Network    Social Network: Not on file  Intimate Partner Violence: Unknown (12/29/2021)   Received from [facility]   HITS    Physically Hurt: Not on file    Insult or Talk Down To: Not on file    Threaten Physical Harm: Not on file    Scream or Curse: Not on file   No family history on file.  Current Outpatient Medications  Medication Sig Dispense Refill   albuterol  (PROVENTIL  HFA;VENTOLIN  HFA) 108 (90 Base) MCG/ACT inhaler Inhale 2 puffs into the lungs every 6 (six) hours as needed for wheezing (or coughing). 1 Inhaler 0   Ascorbic Acid (VITAMIN C PO) Take 1 tablet by mouth 3 (three) times a week.     aspirin EC 325 MG tablet Take 325 mg by mouth daily.     dapagliflozin  propanediol (FARXIGA ) 10 MG TABS tablet TAKE 1 TABLET(10 MG) BY MOUTH DAILY BEFORE BREAKFAST 90 tablet 0   metoprolol  succinate (TOPROL -XL) 50 MG 24 hr tablet Take 1 tablet (50 mg total) by mouth daily. Take with or immediately following a meal. 90 tablet 3   naproxen sodium (ALEVE) 220 MG tablet Take 220 mg by mouth in the morning and at bedtime. Mid morning & evening     polyethylene glycol powder  (GLYCOLAX/MIRALAX) 17 GM/SCOOP powder Take 17 g by mouth as needed for moderate constipation.     sacubitril-valsartan (ENTRESTO ) 49-51 MG Take 1 tablet by mouth 2 (two) times daily. 60 tablet 11   torsemide  (DEMADEX ) 20 MG tablet Take 1 tablet (20 mg total) by mouth daily. 90 tablet 3   doxycycline  (VIBRAMYCIN ) 100 MG capsule Take 1 capsule (100 mg total) by mouth 2 (two) times daily. 20 capsule 0   metoprolol  tartrate (LOPRESSOR ) 100 MG tablet Take 2 hours prior to Cardiac CT (Patient not taking: Reported on 07/31/2023) 1 tablet 0   tamsulosin (FLOMAX) 0.4 MG CAPS capsule Take 0.4 mg by mouth in the morning. (Patient not taking: Reported on 07/31/2023)     No current facility-administered medications for this visit.    No Known Allergies  REVIEW OF SYSTEMS (negative unless checked):   Cardiac:  []  Chest pain or chest pressure? []  Shortness of breath upon activity? []  Shortness of breath when lying flat? []  Irregular heart rhythm?  Vascular:  []  Pain in calf, thigh, or hip brought on by walking? []  Pain in feet at night that wakes you up from your sleep? []  Blood clot in your veins? []  Leg swelling?  Pulmonary:  []  Oxygen at home? []  Productive cough? []  Wheezing?  Neurologic:  []  Sudden weakness in arms or legs? []  Sudden numbness in arms or legs? []  Sudden onset of difficult speaking or slurred speech? []  Temporary loss of vision in one eye? []  Problems with dizziness?  Gastrointestinal:  []  Blood in stool? []  Vomited blood?  Genitourinary:  []  Burning when urinating? []  Blood in urine?  Psychiatric:  []  Major depression  Hematologic:  []  Bleeding problems? []  Problems with blood clotting?  Dermatologic:  [x]  Rashes or ulcers?  Constitutional:  []  Fever or chills?  Ear/Nose/Throat:  []  Change in hearing? []  Nose bleeds? []  Sore throat?  Musculoskeletal:  []  Back pain? []  Joint pain? []  Muscle pain?   Physical Examination     Vitals:    12/02/23 0824  BP: (!) 190/115  Pulse: 72  Resp: 18  Temp: (!) 97.3 F (36.3 C)  TempSrc: Temporal  Weight: 248 lb 9.6 oz (112.8 kg)  Height: 6' 2 (1.88 m)   Body mass index is 31.92 kg/m.  General:  WDWN in NAD; vital signs documented above Gait: Not observed HENT: WNL, normocephalic Pulmonary: normal non-labored breathing , without Rales, rhonchi,  wheezing Cardiac: regular Abdomen: soft, NT, no masses Skin: without rashes Vascular Exam/Pulses: 1+ left PT pulse.  Nonpalpable left DP pulse Extremities: LLE with stasis pigmentation, small varicose veins, and small spider veins. No  edema. Several dry ulcerations without signs of infection Musculoskeletal: no muscle wasting or atrophy  Neurologic: A&O X 3;  No focal weakness or paresthesias are detected Psychiatric:  The pt has Normal affect.         Non-invasive Vascular Imaging   LLE Venous Insufficiency Duplex (11/25/2023):  +--------------+---------+------+-----------+------------+--------+  LEFT         Reflux NoRefluxReflux TimeDiameter cmsComments                          Yes                                   +--------------+---------+------+-----------+------------+--------+  CFV          no                                              +--------------+---------+------+-----------+------------+--------+  FV prox       no                                              +--------------+---------+------+-----------+------------+--------+  FV mid        no                                              +--------------+---------+------+-----------+------------+--------+  FV dist       no                                              +--------------+---------+------+-----------+------------+--------+  Popliteal              yes   >1 second                       +--------------+---------+------+-----------+------------+--------+  GSV at [facility]    no                             0.35              +--------------+---------+------+-----------+------------+--------+  GSV prox thighno                            0.37              +--------------+---------+------+-----------+------------+--------+  GSV mid thigh no                            0.50              +--------------+---------+------+-----------+------------+--------+  GSV dist thighno                            0.32              +--------------+---------+------+-----------+------------+--------+  GSV at knee   no  0.32              +--------------+---------+------+-----------+------------+--------+  GSV prox calf no                            0.28              +--------------+---------+------+-----------+------------+--------+  GSV mid calf            yes    >500 ms      0.30              +--------------+---------+------+-----------+------------+--------+  GSV dist calf           yes    >500 ms      0.32              +--------------+---------+------+-----------+------------+--------+  SSV prox calf no                            0.27              +--------------+---------+------+-----------+------------+--------+  SSV mid calf  no                            0.34              +--------------+---------+------+-----------+------------+--------+    Medical Decision Making   Roberto Butler is a 63 y.o. male who presents for evaluation of left leg wounds  Based on the patient's duplex, there is reflux in the left popliteal vein and greater saphenous vein at the mid and distal calf.  The remainder of the patient's deep and superficial venous system on the left is competent.  He would not be a candidate for greater saphenous ablation, nor would this help with his wounds The patient reports development of several left lower leg wounds after being bit by a brown recluse spider in May.  He says that his wounds have been very slow  to heal.  Previously he was trying to clean his wounds with hydrogen peroxide and Neosporin, which did make his skin irritated and caused them to bleed.  Ever since he went to the emergency room in August, he now cleans his wounds only with soap and water.  He has been on 2 courses of doxycycline .  One of his wounds lays on top of a small varicose vein, which has had a couple of bleeding events since his wound development.  This wound has now gotten smaller and has not had any bleeding events for over a month. The patient denies any prior history of poorly healing lower extremity wounds.  He denies any history of venous ulcerations, painful varicose veins, or leg swelling.  He also denies any rest pain or claudication. On exam he has a weakly palpable left PT pulse.  He does not have a palpable DP pulse.  He has several dry ulcerations to the left lower leg.  There are no signs of infection such as erythema or purulent drainage.  He does have brawny discoloration around his left lower leg.  He has no lower extremity swelling. I have explained to the patient that he does have evidence of venous insufficiency in his left lower extremity, however this is not dangerous to him.  I do not think that his venous insufficiency is related to his wounds at all.  It is fairly common for brown recluse bites to cause  skin necrosis and ulcerations, which can take several weeks to months to heal.  He would benefit from specialized wound care to this area.  Also, given that he only has a weakly palpable PT pulse, he may have underlying arterial insufficiency contributing to his slow healing.  I will refer the patient to Dr.Duda or the wound care center for further treatment.  The patient will also come back to our clinic in a couple of weeks with ABIs and left lower extremity arterial duplex to better define his blood flow.  He is aware that if he has significantly decreased blood flow in the left lower extremity, he may require  vascular intervention  Roberto Bitting SHAUNNA Holster, PA-C [facility] Office: [phone]  12/02/2023, 9:11 AM  Clinic MD: Gretta

## 2023-12-03 ENCOUNTER — Other Ambulatory Visit: Payer: Self-pay

## 2023-12-03 DIAGNOSIS — I739 Peripheral vascular disease, unspecified: Secondary | ICD-10-CM

## 2023-12-05 ENCOUNTER — Other Ambulatory Visit: Payer: Self-pay | Admitting: Internal Medicine

## 2023-12-08 ENCOUNTER — Other Ambulatory Visit: Payer: Self-pay

## 2023-12-08 DIAGNOSIS — I739 Peripheral vascular disease, unspecified: Secondary | ICD-10-CM

## 2023-12-09 ENCOUNTER — Encounter: Payer: Self-pay | Admitting: Physician Assistant

## 2023-12-09 ENCOUNTER — Ambulatory Visit: Admitting: Physician Assistant

## 2023-12-09 DIAGNOSIS — L98492 Non-pressure chronic ulcer of skin of other sites with fat layer exposed: Secondary | ICD-10-CM | POA: Diagnosis not present

## 2023-12-09 NOTE — Progress Notes (Signed)
 Office Visit Note   Patient: Roberto Butler           Date of Birth: February 08, 1961           MRN: [id number] Visit Date: 12/09/2023              Requested by: Cristopher Suzen CHRISTELLA, NP [address] [place],  [place] [address] PCP: Cristopher Suzen CHRISTELLA, NP  Chief Complaint  Patient presents with   Left Leg - Wound Check      HPI: 63 y/o male with history of brown recluse spider bites to the left lower leg in May of 2025.  He denies history of non healing wounds.  He denies edema in his legs or skin changes prior to this injury.  He is able to walk without pain or claudication symptoms.  He has had bleeding episodes from a small wound anterior medial ankle over a varicose vein.  He has developed brawny skin changes.                Past medical history HTN, no smoker.  He was seen on 12/02/23 for wound debridement followed by measurements.  Plan was for Vashe wet to dry to the left LE open wounds ace compression wrap. We discussed the importance of elevation while at rest.  He is followed by [facility] as well and will have non invasive studies on 01/06/24.    He is pleased with his healing on the left leg and has noted decreased edema with the ace wrap and elevation.  He discovered a new wound on the right lateral leg.  He thinks he ran into something 1-2 weeks ago.    Assessment & Plan: Visit Diagnoses:  1. 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  Plan for continue Vashe wet to dry to the left LE open wounds ace compression wrap. Elevation when at rest.  Follow-Up Instructions: Return in about 1 week (around 12/16/2023).   Ortho Exam  Patient is alert, oriented, no adenopathy, well-dressed, normal affect, normal respiratory effort. Left lower extremity wound were debrided after verbal consent with a 10 blade to healthy tissue.  The wounds measure left 1.5 cm x 1.5 cm medial leg, 1.9 cm x 1 cm proximal posterior leg wound, and distal posterior  leg wound 3 cm x 2 cm.  Left foot is warm and well perfused without ischemic changes.  The brawny skin changes around the wounds on the left leg have improved in appearance as well and decreased edema over all.  No cellulitis is noted.       The right lower extremity lateral leg has a wound that is 1 cm x 1 cm without drainage or cellulitis.  Scab was removed using a 10 blade to healthy tissue.      Imaging: No results found. No images are attached to the encounter.  Labs: No results found for: HGBA1C, ESRSEDRATE, CRP, LABURIC, REPTSTATUS, GRAMSTAIN, CULT, LABORGA   Lab Results  Component Value Date   ALBUMIN 5.0 (H) 07/31/2023   ALBUMIN 4.0 02/15/2022    No results found for: MG No results found for: VD25OH  No results found for: PREALBUMIN    Latest Ref Rng & Units 07/31/2023    4:32 PM 02/15/2022   10:17 AM  CBC EXTENDED  WBC 3.4 - 10.8 x10E3/uL 8.9  8.1   RBC 4.14 - 5.80 x10E6/uL 5.28  4.19   Hemoglobin 13.0 -  17.7 g/dL 82.0  85.9   HCT 62.4 - 51.0 % 51.9  40.6   Platelets 150 - 450 x10E3/uL 258  246      There is no height or weight on file to calculate BMI.  Orders:  No orders of the defined types were placed in this encounter.  No orders of the defined types were placed in this encounter.    Procedures: No procedures performed  Clinical Data: No additional findings.  ROS:  All other systems negative, except as noted in the HPI. Review of Systems  Objective: Vital Signs: There were no vitals taken for this visit.  Specialty Comments:  No specialty comments available.  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 No pertinent family history.  History reviewed. No pertinent surgical history. Social History   Occupational History   Not on file  Tobacco Use   Smoking status: Never   Smokeless tobacco: Never  Substance and Sexual Activity   Alcohol use: Yes    Alcohol/week: 6.0 standard drinks of alcohol    Types: 6 Glasses of wine per week    Comment: Every weekend   Drug use: Not Currently   Sexual activity: Yes

## 2023-12-15 ENCOUNTER — Encounter: Payer: Self-pay | Admitting: Radiology

## 2023-12-16 ENCOUNTER — Ambulatory Visit (INDEPENDENT_AMBULATORY_CARE_PROVIDER_SITE_OTHER): Admitting: Physician Assistant

## 2023-12-16 ENCOUNTER — Encounter: Payer: Self-pay | Admitting: Physician Assistant

## 2023-12-16 DIAGNOSIS — L98492 Non-pressure chronic ulcer of skin of other sites with fat layer exposed: Secondary | ICD-10-CM | POA: Diagnosis not present

## 2023-12-16 MED ORDER — DOXYCYCLINE HYCLATE 100 MG PO TABS: 100.0000 mg | ORAL_TABLET | Freq: Two times a day (BID) | ORAL | 0 refills | Status: AC

## 2023-12-16 NOTE — Progress Notes (Signed)
 Office Visit Note   Patient: Roberto Butler           Date of Birth: 26-Jul-1960           MRN: [id number] Visit Date: 12/16/2023              Requested by: Cristopher Suzen CHRISTELLA, NP [address] [place],  [place] [address] PCP: Cristopher Suzen CHRISTELLA, NP  Chief Complaint  Patient presents with   Right Leg - Follow-up   Left Leg - Follow-up      HPI: 63 y/o male with history of brown recluse spider bites to the left lower leg in May of 2025.  He denies history of non healing wounds.  He denies edema in his legs or skin changes prior to this injury.  He is able to walk without pain or claudication symptoms.  He has had bleeding episodes from a small wound anterior medial ankle over a varicose vein.  He has developed brawny skin changes.   He has been performing elevation with Vashe daily dressing changes.  PRN shower with soap and water.   Assessment & Plan: Visit Diagnoses:  1. 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  He will elevate and continue with daily Vashe wet to dry dressing changes.   Follow-Up Instructions: Return in about 1 week (around 12/23/2023).   Ortho Exam  Patient is alert, oriented, no adenopathy, well-dressed, normal affect, normal respiratory effort. Posterior wound measured on a diagonal for length 4 cm x 2.5 cm, posterior proximal 1.7 cm x 0.7 cm, medial wound 1.5 cm x 1.7 cm.  After verbal consent the wounds were debrided of scabs and yellow fibrinous tissue with a 10 blade.  Mild cellulitis.         Imaging: No results found. No images are attached to the encounter.  Labs: No results found for: HGBA1C, ESRSEDRATE, CRP, LABURIC, REPTSTATUS, GRAMSTAIN, CULT, LABORGA   Lab Results  Component Value Date   ALBUMIN 5.0 (H) 07/31/2023   ALBUMIN 4.0 02/15/2022    No results found for: MG No results found for: VD25OH  No  results found for: PREALBUMIN    Latest Ref Rng & Units 07/31/2023    4:32 PM 02/15/2022   10:17 AM  CBC EXTENDED  WBC 3.4 - 10.8 x10E3/uL 8.9  8.1   RBC 4.14 - 5.80 x10E6/uL 5.28  4.19   Hemoglobin 13.0 - 17.7 g/dL 82.0  85.9   HCT 62.4 - 51.0 % 51.9  40.6   Platelets 150 - 450 x10E3/uL 258  246      There is no height or weight on file to calculate BMI.  Orders:  No orders of the defined types were placed in this encounter.  Meds ordered this encounter  Medications   doxycycline  (VIBRA -TABS) 100 MG tablet    Sig: Take 1 tablet (100 mg total) by mouth 2 (two) times daily.    Dispense:  28 tablet    Refill:  0    Supervising Provider:   DUDA, MARCUS V [[id number]]     Procedures: No procedures performed  Clinical Data: No additional findings.  ROS:  All other systems negative, except as noted in the HPI. Review of Systems  Objective: Vital Signs: There were no vitals taken for this visit.  Specialty Comments:  No specialty comments available.  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 No pertinent family history.  History reviewed. No pertinent surgical history. Social History   Occupational History   Not on file  Tobacco Use   Smoking status: Never   Smokeless tobacco: Never  Substance and Sexual Activity   Alcohol use: Yes    Alcohol/week: 6.0 standard drinks of alcohol    Types: 6 Glasses of wine per week    Comment: Every weekend   Drug use: Not Currently   Sexual activity: Yes

## 2023-12-23 ENCOUNTER — Ambulatory Visit (INDEPENDENT_AMBULATORY_CARE_PROVIDER_SITE_OTHER): Admitting: Physician Assistant

## 2023-12-23 DIAGNOSIS — L98492 Non-pressure chronic ulcer of skin of other sites with fat layer exposed: Secondary | ICD-10-CM

## 2023-12-24 NOTE — Progress Notes (Signed)
 Office Visit Note   Patient: Roberto Butler           Date of Birth: 1960-05-14           MRN: [id number] Visit Date: 12/23/2023              Requested by: Cristopher Suzen CHRISTELLA, NP [address] [place],  [place] [address] PCP: Cristopher Suzen CHRISTELLA, NP  Chief Complaint  Patient presents with   Right Leg - Wound Check   Left Leg - Wound Check      HPI: 63 y/o male with history of brown recluse spider bites to the left lower leg in May of 2025. He denies history of non healing wounds. He denies edema in his legs or skin changes prior to this injury. He is able to walk without pain or claudication symptoms.   He does have visible varicose veins in the lower legs.  His reflux study showed deep popliteal reflux only on the left LE.  He has only developed darkening of the skin and open ulcers since this event.  He was out of town with friends and played golf, as well as walked around a lot.  His ulcers are larger and he admits to not elevating.    Assessment & Plan: Visit Diagnoses:  1. 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   Ortho Exam  Patient is alert, oriented, no adenopathy, well-dressed, normal affect, normal respiratory effort. The left LE ulcers measure multiple small flat ulcer 1 cm, anterior shine 3 x 6 cm, 3 x 2 cm.  Posterior 3 x 6 cm.  Increased edema in the left leg.  No cellulitis.  Brawny skin changes.      Imaging: No results found.   Labs: No results found for: HGBA1C, ESRSEDRATE, CRP, LABURIC, REPTSTATUS, GRAMSTAIN, CULT, LABORGA   Lab Results  Component Value Date   ALBUMIN 5.0 (H) 07/31/2023   ALBUMIN 4.0 02/15/2022    No results found for: MG No results found for: VD25OH  No results found for: PREALBUMIN    Latest Ref Rng & Units 07/31/2023    4:32 PM 02/15/2022   10:17 AM  CBC EXTENDED  WBC 3.4 - 10.8  x10E3/uL 8.9  8.1   RBC 4.14 - 5.80 x10E6/uL 5.28  4.19   Hemoglobin 13.0 - 17.7 g/dL 82.0  85.9   HCT 62.4 - 51.0 % 51.9  40.6   Platelets 150 - 450 x10E3/uL 258  246      There is no height or weight on file to calculate BMI.  Orders:  No orders of the defined types were placed in this encounter.  No orders of the defined types were placed in this encounter.    Procedures: No procedures performed  Clinical Data: No additional findings.  ROS:  All other systems negative, except as noted in the HPI. Review of Systems  Objective: Vital Signs: There were no vitals taken for this visit.  Specialty Comments:  No specialty comments available.  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   Acute blood loss anemia 12/29/2021   Hyponatremia 12/29/2021   Pleural effusion on right 12/29/2021  Primary hypertension 12/29/2021   Pneumonia of right lung due to infectious organism 12/29/2021   Age-related nuclear cataract of both eyes 08/18/2019   Vitreomacular traction syndrome of left eye 08/18/2019   Anisometropia 12/17/2017   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 No pertinent family history.  History reviewed. No pertinent surgical history. Social History   Occupational History   Not on file  Tobacco Use   Smoking status: Never   Smokeless tobacco: Never  Substance and Sexual  Activity   Alcohol use: Yes    Alcohol/week: 6.0 standard drinks of alcohol    Types: 6 Glasses of wine per week    Comment: Every weekend   Drug use: Not Currently   Sexual activity: Yes

## 2023-12-29 ENCOUNTER — Encounter: Payer: Self-pay | Admitting: Physician Assistant

## 2023-12-29 ENCOUNTER — Ambulatory Visit (INDEPENDENT_AMBULATORY_CARE_PROVIDER_SITE_OTHER): Admitting: Physician Assistant

## 2023-12-29 DIAGNOSIS — L98492 Non-pressure chronic ulcer of skin of other sites with fat layer exposed: Secondary | ICD-10-CM | POA: Diagnosis not present

## 2023-12-29 NOTE — Progress Notes (Signed)
 Office Visit Note   Patient: Roberto Butler           Date of Birth: 08-15-1960           MRN: [id number] Visit Date: 12/29/2023              Requested by: Cristopher Suzen CHRISTELLA, NP [address] [place],  [place] [address] PCP: Cristopher Suzen CHRISTELLA, NP  Chief Complaint  Patient presents with   Left Leg - Follow-up      HPI: 63 y/o male with history of brown recluse spider bites to the left lower leg in May of 2025. He denies history of non healing wounds. He denies edema in his legs or skin changes prior to this injury. He is able to walk without pain or claudication symptoms.              He does have visible varicose veins in the lower legs.  His reflux study showed deep popliteal reflux only on the left LE.  He has only developed darkening of the skin and open ulcers since this event.  Assessment & Plan: Visit Diagnoses: No diagnosis found.  Plan: Dynaflex wrap left LE.  Vashe wet to dry right lateral leg wound.  Elevation multiple times a day.    Follow-Up Instructions: No follow-ups on file.   Ortho Exam  Patient is alert, oriented, no adenopathy, well-dressed, normal affect, normal respiratory effort. Anterior left shin 1 x 2.5 cm superficial/flat with 100 % granulation.  Posterior 3 cm x 6 cm.  The right lateral leg 1 cm radius with 100 % granulation tissue.  Palpable DP pulse left foot. Good skin lines with decreased edema.              Imaging: No results found. No images are attached to the encounter.  Labs: No results found for: HGBA1C, ESRSEDRATE, CRP, LABURIC, REPTSTATUS, GRAMSTAIN, CULT, LABORGA   Lab Results  Component Value Date   ALBUMIN 5.0 (H) 07/31/2023   ALBUMIN 4.0 02/15/2022    No results found for: MG No results found for: VD25OH  No results found for: PREALBUMIN    Latest Ref Rng & Units 07/31/2023    4:32 PM 02/15/2022   10:17 AM  CBC EXTENDED  WBC 3.4 - 10.8 x10E3/uL 8.9  8.1   RBC 4.14 - 5.80  x10E6/uL 5.28  4.19   Hemoglobin 13.0 - 17.7 g/dL 82.0  85.9   HCT 62.4 - 51.0 % 51.9  40.6   Platelets 150 - 450 x10E3/uL 258  246      There is no height or weight on file to calculate BMI.  Orders:  No orders of the defined types were placed in this encounter.  No orders of the defined types were placed in this encounter.    Procedures: No procedures performed  Clinical Data: No additional findings.  ROS:  All other systems negative, except as noted in the HPI. Review of Systems  Objective: Vital Signs: There were no vitals taken for this visit.  Specialty Comments:  No specialty comments available.  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  No past surgical history on file. Social History   Occupational History   Not on file  Tobacco Use   Smoking status: Never   Smokeless tobacco: Never  Substance and Sexual Activity   Alcohol use: Yes    Alcohol/week: 6.0 standard drinks of alcohol     Types: 6 Glasses of wine per week    Comment: Every weekend   Drug use: Not Currently   Sexual activity: Yes

## 2024-01-05 ENCOUNTER — Ambulatory Visit: Admitting: Physician Assistant

## 2024-01-05 NOTE — Progress Notes (Deleted)
 Office Visit Note   Patient: Roberto Butler           Date of Birth: Jun 13, 1960           MRN: [id number] Visit Date: 01/05/2024              Requested by: Cristopher Suzen CHRISTELLA, NP [address] [place],  [place] [address] PCP: Cristopher Suzen CHRISTELLA, NP  No chief complaint on file.     HPI: 63 y/o male with history of brown recluse spider bites to the left lower leg in May of 2025. He denies history of non healing wounds. He denies edema in his legs or skin changes prior to this injury. He is able to walk without pain or claudication symptoms.              He does have visible varicose veins in the lower legs.  His reflux study showed deep popliteal reflux only on the left LE.  He has only developed darkening of the skin and open ulcers since this event.  He was out of town with friends and played golf, as well as walked around a lot.  His ulcers are larger and he admits to not elevating.  He has been wrapped for compression to decrease the edema.    Assessment & Plan: Visit Diagnoses: No diagnosis found.  Plan: ***  Follow-Up Instructions: No follow-ups on file.   Ortho Exam  Patient is alert, oriented, no adenopathy, well-dressed, normal affect, normal respiratory effort. ***    Imaging: No results found. No images are attached to the encounter.  Labs: No results found for: HGBA1C, ESRSEDRATE, CRP, LABURIC, REPTSTATUS, GRAMSTAIN, CULT, LABORGA   Lab Results  Component Value Date   ALBUMIN 5.0 (H) 07/31/2023   ALBUMIN 4.0 02/15/2022    No results found for: MG No results found for: VD25OH  No results found for: PREALBUMIN    Latest Ref Rng & Units 07/31/2023    4:32 PM 02/15/2022   10:17 AM  CBC EXTENDED  WBC 3.4 - 10.8 x10E3/uL 8.9  8.1   RBC 4.14 - 5.80 x10E6/uL 5.28  4.19   Hemoglobin 13.0 - 17.7 g/dL 82.0  85.9   HCT 62.4 - 51.0 % 51.9  40.6   Platelets 150 - 450 x10E3/uL 258  246      There is no height or weight on file to  calculate BMI.  Orders:  No orders of the defined types were placed in this encounter.  No orders of the defined types were placed in this encounter.    Procedures: No procedures performed  Clinical Data: No additional findings.  ROS:  All other systems negative, except as noted in the HPI. Review of Systems  Objective: Vital Signs: There were no vitals taken for this visit.  Specialty Comments:  No specialty comments available.  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  No past surgical history on file. Social History   Occupational History   Not on file  Tobacco Use   Smoking status: Never   Smokeless tobacco: Never  Substance and Sexual Activity   Alcohol use: Yes    Alcohol/week: 6.0 standard drinks of alcohol    Types: 6 Glasses of wine per week    Comment: Every weekend   Drug use: Not Currently   Sexual activity: Yes

## 2024-01-06 ENCOUNTER — Ambulatory Visit (HOSPITAL_COMMUNITY)

## 2024-01-06 ENCOUNTER — Ambulatory Visit (INDEPENDENT_AMBULATORY_CARE_PROVIDER_SITE_OTHER): Admitting: Physician Assistant

## 2024-01-06 ENCOUNTER — Ambulatory Visit

## 2024-01-06 DIAGNOSIS — L98492 Non-pressure chronic ulcer of skin of other sites with fat layer exposed: Secondary | ICD-10-CM | POA: Diagnosis not present

## 2024-01-06 NOTE — Progress Notes (Signed)
 Office Visit Note   Patient: Roberto Butler           Date of Birth: 1960/12/30           MRN: [id number] Visit Date: 01/06/2024              Requested by: Cristopher Suzen CHRISTELLA, NP [address] [place],  [place] [address] PCP: Cristopher Suzen CHRISTELLA, NP  Chief Complaint  Patient presents with   Left Leg - Follow-up      HPI: 63 y/o male with history of brown recluse spider bites to the left lower leg in May of 2025. He denies history of non healing wounds. He denies edema in his legs or skin changes prior to this injury. He is able to walk without pain or claudication symptoms.              He does have visible varicose veins in the lower legs.  His reflux study showed deep popliteal reflux only on the left LE.  He has only developed darkening of the skin and open ulcers since this event.  Assessment & Plan: Visit Diagnoses:  1. 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 Vashe wet to dry right lateral leg wound. Elevation multiple times a day.   Follow-Up Instructions: Return in about 1 week (around 01/13/2024).   Ortho Exam  Patient is alert, oriented, no adenopathy, well-dressed, normal affect, normal respiratory effort. Minimal edema with good skin lines.   Anterior shin 1 cm x 1 cm, posterior 3 cm x 5.5 cm and lateral 1 cm x 5 mm.  He has palpable DP pulse and no ischemic foot skin changes.        Imaging: No results found.     Labs: No results found for: HGBA1C, ESRSEDRATE, CRP, LABURIC, REPTSTATUS, GRAMSTAIN, CULT, LABORGA   Lab Results  Component Value Date   ALBUMIN 5.0 (H) 07/31/2023   ALBUMIN 4.0 02/15/2022    No results found for: MG No results found for: VD25OH  No results found for: PREALBUMIN    Latest Ref Rng & Units 07/31/2023    4:32 PM 02/15/2022   10:17 AM  CBC EXTENDED  WBC 3.4 - 10.8 x10E3/uL 8.9  8.1   RBC 4.14 - 5.80 x10E6/uL 5.28  4.19   Hemoglobin 13.0 - 17.7 g/dL 82.0   85.9   HCT 62.4 - 51.0 % 51.9  40.6   Platelets 150 - 450 x10E3/uL 258  246      There is no height or weight on file to calculate BMI.  Orders:  No orders of the defined types were placed in this encounter.  No orders of the defined types were placed in this encounter.    Procedures: No procedures performed  Clinical Data: No additional findings.  ROS:  All other systems negative, except as noted in the HPI. Review of Systems  Objective: Vital Signs: There were no vitals taken for this visit.  Specialty Comments:  No specialty comments available.  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   Acute blood loss anemia 12/29/2021   Hyponatremia 12/29/2021   Pleural effusion on right 12/29/2021   Primary hypertension 12/29/2021   Pneumonia of right lung  due to infectious organism 12/29/2021   Age-related nuclear cataract of both eyes 08/18/2019   Vitreomacular traction syndrome of left eye 08/18/2019   Anisometropia 12/17/2017   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 No pertinent family history.  History reviewed. No pertinent surgical history. Social History   Occupational History   Not on file  Tobacco Use   Smoking status: Never   Smokeless tobacco: Never  Substance and Sexual Activity   Alcohol use: Yes    Alcohol/week: 6.0 standard drinks of alcohol    Types: 6 Glasses  of wine per week    Comment: Every weekend   Drug use: Not Currently   Sexual activity: Yes

## 2024-01-11 ENCOUNTER — Encounter: Payer: Self-pay | Admitting: Physician Assistant

## 2024-01-13 ENCOUNTER — Ambulatory Visit: Admitting: Physician Assistant

## 2024-01-13 DIAGNOSIS — L98492 Non-pressure chronic ulcer of skin of other sites with fat layer exposed: Secondary | ICD-10-CM | POA: Diagnosis not present

## 2024-01-13 NOTE — Progress Notes (Unsigned)
 Office Visit Note   Patient: Roberto Butler           Date of Birth: 1960/07/22           MRN: [id number] Visit Date: 01/13/2024              Requested by: Cristopher Suzen CHRISTELLA, NP [address] [place],  [place] [address] PCP: Cristopher Suzen CHRISTELLA, NP  No chief complaint on file.     HPI: 63 y/o male with history of brown recluse spider bites to the left lower leg in May of 2025. He denies history of non healing wounds. He denies edema in his legs or skin changes prior to this injury. He is able to walk without pain or claudication symptoms.              He does have visible varicose veins in the lower legs.  His reflux study showed deep popliteal reflux only on the left LE.  He has only developed darkening of the skin and open ulcers since this event.  We have been using Dynaflex wraps for wound healing and to decrease edema.    Assessment & Plan: Visit Diagnoses: No diagnosis found.  Plan: We will perform showers as needed with soap and water to remove the dead skin.  Vashe wet to dry dressing changes daily with elevation to keep the swelling out of the right LE.  Our goal is ultimately to have him wear compression socks daily once the wounds heal.   Follow-Up Instructions: No follow-ups on file.   Ortho Exam  Patient is alert, oriented, no adenopathy, well-dressed, normal affect, normal respiratory effort. Wounds measures 2 cm x 1 cm medially and posterior 2 cm x 2.5 cm.  After verbal consent I debrided the dry scaly skin from the wounds with a 10 blade.  The wounds and peri wound were cleaned with Vashe.  He has palpable DP pulse and no ischemic foot skin changes.  Minimal edema is present and he has good skin lines.              Imaging: No results found. No images are attached to the encounter.  Labs: No results found for: HGBA1C, ESRSEDRATE, CRP, LABURIC, REPTSTATUS, GRAMSTAIN, CULT, LABORGA   Lab Results  Component Value Date   ALBUMIN  5.0 (H) 07/31/2023   ALBUMIN 4.0 02/15/2022    No results found for: MG No results found for: VD25OH  No results found for: PREALBUMIN    Latest Ref Rng & Units 07/31/2023    4:32 PM 02/15/2022   10:17 AM  CBC EXTENDED  WBC 3.4 - 10.8 x10E3/uL 8.9  8.1   RBC 4.14 - 5.80 x10E6/uL 5.28  4.19   Hemoglobin 13.0 - 17.7 g/dL 82.0  85.9   HCT 62.4 - 51.0 % 51.9  40.6   Platelets 150 - 450 x10E3/uL 258  246      There is no height or weight on file to calculate BMI.  Orders:  No orders of the defined types were placed in this encounter.  No orders of the defined types were placed in this encounter.    Procedures: No procedures performed  Clinical Data: No additional findings.  ROS:  All other systems negative, except as noted in the HPI. Review of Systems  Objective: Vital Signs: There were no vitals taken for this visit.  Specialty Comments:  No specialty comments available.  PMFS History: Patient Active Problem List   Diagnosis Date Noted  Heart failure, type unknown (HCC) 03/19/2022   Alcohol use 03/19/2022   RBBB 03/19/2022   Heart block AV first degree 03/19/2022   SIADH (syndrome of inappropriate ADH production) 12/30/2021   Acute blood loss anemia 12/29/2021   Hyponatremia 12/29/2021   Pleural effusion on right 12/29/2021   Primary hypertension 12/29/2021   Pneumonia of right lung due to infectious organism 12/29/2021   Age-related nuclear cataract of both eyes 08/18/2019   Vitreomacular traction syndrome of left eye 08/18/2019   Anisometropia 12/17/2017   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  No past surgical history on file. Social History   Occupational History   Not on file  Tobacco Use   Smoking status: Never   Smokeless tobacco: Never  Substance and Sexual Activity   Alcohol use: Yes    Alcohol/week: 6.0 standard drinks of alcohol    Types: 6 Glasses of wine per week    Comment: Every weekend   Drug use: Not Currently   Sexual activity: Yes

## 2024-01-14 ENCOUNTER — Encounter: Payer: Self-pay | Admitting: Physician Assistant

## 2024-01-20 ENCOUNTER — Ambulatory Visit: Admitting: Physician Assistant

## 2024-01-20 NOTE — Progress Notes (Unsigned)
 Office Visit Note   Patient: Roberto Butler           Date of Birth: [birth year]           MRN: [id number] Visit Date: 01/20/2024              Requested by: Cristopher Suzen CHRISTELLA, NP [address] [place],  [place] [address] PCP: Cristopher Suzen CHRISTELLA, NP  Chief Complaint  Patient presents with  . Left Leg - Wound Check      HPI: ***  Assessment & Plan: Visit Diagnoses: No diagnosis found.  Plan: ***  Follow-Up Instructions: No follow-ups on file.   Ortho Exam  Patient is alert, oriented, no adenopathy, well-dressed, normal affect, normal respiratory effort. ***    Imaging: No results found. No images are attached to the encounter.  Labs: No results found for: HGBA1C, ESRSEDRATE, CRP, LABURIC, REPTSTATUS, GRAMSTAIN, CULT, LABORGA   Lab Results  Component Value Date   ALBUMIN 5.0 (H) 07/31/2023   ALBUMIN 4.0 02/15/2022    No results found for: MG No results found for: VD25OH  No results found for: PREALBUMIN    Latest Ref Rng & Units 07/31/2023    4:32 PM 02/15/2022   10:17 AM  CBC EXTENDED  WBC 3.4 - 10.8 x10E3/uL 8.9  8.1   RBC 4.14 - 5.80 x10E6/uL 5.28  4.19   Hemoglobin 13.0 - 17.7 g/dL 82.0  85.9   HCT 62.4 - 51.0 % 51.9  40.6   Platelets 150 - 450 x10E3/uL 258  246      There is no height or weight on file to calculate BMI.  Orders:  No orders of the defined types were placed in this encounter.  No orders of the defined types were placed in this encounter.    Procedures: No procedures performed  Clinical Data: No additional findings.  ROS:  All other systems negative, except as noted in the HPI. Review of Systems  Objective: Vital Signs: There were no vitals taken for this visit.  Specialty Comments:  No specialty comments available.  PMFS History: Patient Active Problem List   Diagnosis Date Noted  . Heart failure, type unknown (HCC) 03/19/2022  . Alcohol use 03/19/2022  . RBBB 03/19/2022  . Heart  block AV first degree 03/19/2022  . SIADH (syndrome of inappropriate ADH production) 12/30/2021  . Acute blood loss anemia 12/29/2021  . Hyponatremia 12/29/2021  . Pleural effusion on right 12/29/2021  . Primary hypertension 12/29/2021  . Pneumonia of right lung due to infectious organism 12/29/2021  . Age-related nuclear cataract of both eyes 08/18/2019  . 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 Anisometropia 12/17/2017  . 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 Puckering of macula, left eye 04/25/2015  . Vitreous hemorrhage of left eye (HCC) 01/31/2015  . 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 Hypertensive retinopathy of both eyes 12/27/2014  . Senile nuclear sclerosis 12/27/2014   Past Medical History:  Diagnosis Date  . Hypertension     No family history on file.  No past surgical history on file. Social History   Occupational History  . Not on file  Tobacco Use  . Smoking status: Never  . Smokeless tobacco: Never  Substance and Sexual Activity  . Alcohol use: Yes    Alcohol/week: 6.0 standard drinks of alcohol    Types: 6 Glasses of wine per week    Comment: Every weekend  .  Drug use: Not Currently  . Sexual activity: Yes

## 2024-01-21 ENCOUNTER — Encounter: Payer: Self-pay | Admitting: Physician Assistant

## 2024-02-09 ENCOUNTER — Ambulatory Visit: Admitting: Physician Assistant

## 2024-02-17 ENCOUNTER — Ambulatory Visit (INDEPENDENT_AMBULATORY_CARE_PROVIDER_SITE_OTHER): Admitting: Physician Assistant

## 2024-02-17 ENCOUNTER — Encounter: Payer: Self-pay | Admitting: Physician Assistant

## 2024-02-17 DIAGNOSIS — L98492 Non-pressure chronic ulcer of skin of other sites with fat layer exposed: Secondary | ICD-10-CM | POA: Diagnosis not present

## 2024-02-17 NOTE — Progress Notes (Signed)
 "  Office Visit Note   Patient: Roberto Butler           Date of Birth: [birth year]           MRN: [id number] Visit Date: 02/17/2024              Requested by: Cristopher Suzen CHRISTELLA, NP [address] [place],  [place] [address] PCP: Cristopher Suzen CHRISTELLA, NP  Chief Complaint  Patient presents with   Left Leg - Wound Check      HPI: 64 y/o male with history of brown recluse spider bites to the left lower leg in May of 2025. He denies history of non healing wounds.              He does have visible varicose veins in the lower legs.  His reflux study showed deep popliteal reflux only on the left LE.  We have been using compression wraps and Vashe wet to dry with ace wrap compression.  He is elevating more to decrease the swelling in his LE's to assist with healing.  He was measured for Compression socks on his last visit on 01/20/24.    He now has 2 pairs of compression socks to include the Vive socks.  He still has discomfort while wearing the socks and places a bandage over the wound when he wears the compression socks.  He has consistently been elevating his legs to decrease edema.    Assessment & Plan: Visit Diagnoses:  1. 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  The wound bed was cleaned with Vashe today.  Wet to dry dressing with ace wrap compression.   Compression with elevation daily. Shower as needed with soap and water followed by Vashe cleaning to decrease the bacterial load.    Follow-Up Instructions: Return in about 3 weeks (around 03/09/2024).   Ortho Exam  Patient is alert, oriented, no adenopathy, well-dressed, normal affect, normal respiratory effort. 1 cm x 2.5 cm 100% granulation flat wound.  The wound bed was debrided with Vashe and 4 x 4 guaze.  Decreased edema with good skin lines, he has minimal indentations above where the ace wrap was on the lower extremity.  No cellulitis or drainage.   He has palpable DP  pulse.     Imaging:      Labs: No results found for: HGBA1C, ESRSEDRATE, CRP, LABURIC, REPTSTATUS, GRAMSTAIN, CULT, LABORGA   Lab Results  Component Value Date   ALBUMIN 5.0 (H) 07/31/2023   ALBUMIN 4.0 02/15/2022    No results found for: MG No results found for: VD25OH  No results found for: PREALBUMIN    Latest Ref Rng & Units 07/31/2023    4:32 PM 02/15/2022   10:17 AM  CBC EXTENDED  WBC 3.4 - 10.8 x10E3/uL 8.9  8.1   RBC 4.14 - 5.80 x10E6/uL 5.28  4.19   Hemoglobin 13.0 - 17.7 g/dL 82.0  85.9   HCT 62.4 - 51.0 % 51.9  40.6   Platelets 150 - 450 x10E3/uL 258  246      There is no height or weight on file to calculate BMI.  Orders:  No orders of the defined types were placed in this encounter.  No orders of the defined types were placed in this encounter.    Procedures: No procedures performed  Clinical Data: No additional findings.  ROS:  All other systems negative, except as noted in  the HPI. Review of Systems  Objective: Vital Signs: There were no vitals taken for this visit.  Specialty Comments:  No specialty comments available.  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 No pertinent family history.  History reviewed. No pertinent surgical history. Social History   Occupational History   Not on file  Tobacco Use   Smoking status: Never   Smokeless tobacco: Never  Substance and Sexual Activity   Alcohol use: Yes    Alcohol/week: 6.0 standard drinks of alcohol    Types: 6 Glasses of wine per week    Comment: Every weekend   Drug use: Not Currently   Sexual activity: Yes       "

## 2024-03-19 ENCOUNTER — Ambulatory Visit: Admitting: Physician Assistant
